# Patient Record
Sex: Female | Born: 1965 | Race: White | Hispanic: No | Smoking: Never smoker
Health system: Southern US, Community
[De-identification: ages and names within clinical notes are randomized; demographics above are authoritative.]

## PROBLEM LIST (undated history)

## (undated) DIAGNOSIS — D219 Benign neoplasm of connective and other soft tissue, unspecified: Secondary | ICD-10-CM

## (undated) DIAGNOSIS — E079 Disorder of thyroid, unspecified: Secondary | ICD-10-CM

## (undated) DIAGNOSIS — F329 Major depressive disorder, single episode, unspecified: Secondary | ICD-10-CM

## (undated) DIAGNOSIS — F32A Depression, unspecified: Secondary | ICD-10-CM

## (undated) DIAGNOSIS — F39 Unspecified mood [affective] disorder: Secondary | ICD-10-CM

## (undated) DIAGNOSIS — D649 Anemia, unspecified: Secondary | ICD-10-CM

## (undated) HISTORY — DX: Benign neoplasm of connective and other soft tissue, unspecified: D21.9

## (undated) HISTORY — PX: APPENDECTOMY: SHX54

## (undated) HISTORY — DX: Depression, unspecified: F32.A

## (undated) HISTORY — DX: Disorder of thyroid, unspecified: E07.9

## (undated) HISTORY — DX: Unspecified mood (affective) disorder: F39

## (undated) HISTORY — DX: Anemia, unspecified: D64.9

## (undated) HISTORY — DX: Major depressive disorder, single episode, unspecified: F32.9

---

## 1999-04-17 ENCOUNTER — Other Ambulatory Visit: Admission: RE | Admit: 1999-04-17 | Discharge: 1999-04-17 | Payer: Self-pay | Admitting: Internal Medicine

## 2000-04-24 ENCOUNTER — Other Ambulatory Visit: Admission: RE | Admit: 2000-04-24 | Discharge: 2000-04-24 | Payer: Self-pay | Admitting: Internal Medicine

## 2001-03-19 ENCOUNTER — Other Ambulatory Visit: Admission: RE | Admit: 2001-03-19 | Discharge: 2001-03-19 | Payer: Self-pay | Admitting: Internal Medicine

## 2002-02-22 ENCOUNTER — Other Ambulatory Visit: Admission: RE | Admit: 2002-02-22 | Discharge: 2002-02-22 | Payer: Self-pay | Admitting: Internal Medicine

## 2004-05-18 ENCOUNTER — Other Ambulatory Visit: Admission: RE | Admit: 2004-05-18 | Discharge: 2004-05-18 | Payer: Self-pay | Admitting: Family Medicine

## 2004-05-18 ENCOUNTER — Encounter: Payer: Self-pay | Admitting: Family Medicine

## 2004-05-18 LAB — CONVERTED CEMR LAB

## 2005-05-20 ENCOUNTER — Other Ambulatory Visit: Admission: RE | Admit: 2005-05-20 | Discharge: 2005-05-20 | Payer: Self-pay | Admitting: Family Medicine

## 2005-06-12 ENCOUNTER — Other Ambulatory Visit: Admission: RE | Admit: 2005-06-12 | Discharge: 2005-06-12 | Payer: Self-pay | Admitting: Obstetrics and Gynecology

## 2006-01-06 ENCOUNTER — Encounter: Payer: Self-pay | Admitting: Family Medicine

## 2006-01-06 LAB — CONVERTED CEMR LAB
ALT: 18 units/L
AST: 22 units/L
BUN: 13 mg/dL
CO2: 30 meq/L
Chloride: 104 meq/L
Potassium: 4.6 meq/L

## 2006-01-16 ENCOUNTER — Other Ambulatory Visit: Admission: RE | Admit: 2006-01-16 | Discharge: 2006-01-16 | Payer: Self-pay | Admitting: Obstetrics and Gynecology

## 2006-01-16 ENCOUNTER — Encounter: Payer: Self-pay | Admitting: Family Medicine

## 2006-04-07 ENCOUNTER — Encounter: Payer: Self-pay | Admitting: Family Medicine

## 2006-07-23 ENCOUNTER — Encounter: Payer: Self-pay | Admitting: Family Medicine

## 2006-07-23 LAB — CONVERTED CEMR LAB
HCT: 34.7 %
Platelets: 291 10*3/uL

## 2006-10-30 ENCOUNTER — Encounter: Payer: Self-pay | Admitting: Family Medicine

## 2006-11-07 ENCOUNTER — Encounter: Payer: Self-pay | Admitting: Family Medicine

## 2006-11-07 DIAGNOSIS — N870 Mild cervical dysplasia: Secondary | ICD-10-CM | POA: Insufficient documentation

## 2006-11-17 ENCOUNTER — Ambulatory Visit: Payer: Self-pay | Admitting: Family Medicine

## 2006-11-17 DIAGNOSIS — F329 Major depressive disorder, single episode, unspecified: Secondary | ICD-10-CM

## 2006-11-18 ENCOUNTER — Encounter: Payer: Self-pay | Admitting: Family Medicine

## 2006-11-18 ENCOUNTER — Telehealth (INDEPENDENT_AMBULATORY_CARE_PROVIDER_SITE_OTHER): Payer: Self-pay | Admitting: *Deleted

## 2006-11-26 ENCOUNTER — Encounter: Payer: Self-pay | Admitting: Family Medicine

## 2007-01-20 ENCOUNTER — Ambulatory Visit: Payer: Self-pay | Admitting: Family Medicine

## 2007-01-20 DIAGNOSIS — B373 Candidiasis of vulva and vagina: Secondary | ICD-10-CM

## 2007-01-21 ENCOUNTER — Encounter: Payer: Self-pay | Admitting: Family Medicine

## 2007-02-03 ENCOUNTER — Encounter: Payer: Self-pay | Admitting: Family Medicine

## 2007-02-17 ENCOUNTER — Telehealth: Payer: Self-pay | Admitting: Family Medicine

## 2007-03-20 ENCOUNTER — Encounter: Payer: Self-pay | Admitting: Family Medicine

## 2007-04-21 ENCOUNTER — Ambulatory Visit: Payer: Self-pay | Admitting: Family Medicine

## 2007-04-21 ENCOUNTER — Encounter: Payer: Self-pay | Admitting: Family Medicine

## 2007-04-21 ENCOUNTER — Other Ambulatory Visit: Admission: RE | Admit: 2007-04-21 | Discharge: 2007-04-21 | Payer: Self-pay | Admitting: Family Medicine

## 2007-04-22 ENCOUNTER — Encounter: Payer: Self-pay | Admitting: Family Medicine

## 2007-04-22 LAB — CONVERTED CEMR LAB
Clue Cells Wet Prep HPF POC: NONE SEEN
Trich, Wet Prep: NONE SEEN
Yeast Wet Prep HPF POC: NONE SEEN

## 2007-04-28 ENCOUNTER — Telehealth (INDEPENDENT_AMBULATORY_CARE_PROVIDER_SITE_OTHER): Payer: Self-pay | Admitting: *Deleted

## 2007-05-01 ENCOUNTER — Telehealth (INDEPENDENT_AMBULATORY_CARE_PROVIDER_SITE_OTHER): Payer: Self-pay | Admitting: *Deleted

## 2007-06-26 ENCOUNTER — Encounter: Payer: Self-pay | Admitting: Family Medicine

## 2007-06-29 ENCOUNTER — Encounter: Payer: Self-pay | Admitting: Family Medicine

## 2007-08-13 ENCOUNTER — Telehealth: Payer: Self-pay | Admitting: Family Medicine

## 2007-08-25 ENCOUNTER — Encounter: Payer: Self-pay | Admitting: Family Medicine

## 2007-08-27 ENCOUNTER — Telehealth: Payer: Self-pay | Admitting: Family Medicine

## 2007-11-03 ENCOUNTER — Telehealth (INDEPENDENT_AMBULATORY_CARE_PROVIDER_SITE_OTHER): Payer: Self-pay | Admitting: *Deleted

## 2007-11-11 ENCOUNTER — Encounter: Payer: Self-pay | Admitting: Family Medicine

## 2008-01-07 ENCOUNTER — Telehealth: Payer: Self-pay | Admitting: Family Medicine

## 2008-01-18 ENCOUNTER — Encounter: Payer: Self-pay | Admitting: Family Medicine

## 2008-03-25 ENCOUNTER — Encounter: Payer: Self-pay | Admitting: Family Medicine

## 2008-03-28 ENCOUNTER — Telehealth: Payer: Self-pay | Admitting: Family Medicine

## 2008-03-28 ENCOUNTER — Encounter: Payer: Self-pay | Admitting: Family Medicine

## 2008-04-13 ENCOUNTER — Ambulatory Visit: Payer: Self-pay | Admitting: Family Medicine

## 2008-05-17 ENCOUNTER — Ambulatory Visit: Payer: Self-pay | Admitting: Family Medicine

## 2008-11-15 ENCOUNTER — Ambulatory Visit: Payer: Self-pay | Admitting: Family Medicine

## 2008-12-15 ENCOUNTER — Ambulatory Visit: Payer: Self-pay | Admitting: Family Medicine

## 2009-05-22 ENCOUNTER — Other Ambulatory Visit: Admission: RE | Admit: 2009-05-22 | Discharge: 2009-05-22 | Payer: Self-pay | Admitting: Family Medicine

## 2009-05-22 ENCOUNTER — Ambulatory Visit: Payer: Self-pay | Admitting: Family Medicine

## 2009-10-26 ENCOUNTER — Ambulatory Visit (HOSPITAL_BASED_OUTPATIENT_CLINIC_OR_DEPARTMENT_OTHER): Admission: RE | Admit: 2009-10-26 | Discharge: 2009-10-26 | Payer: Self-pay | Admitting: Urology

## 2009-11-13 ENCOUNTER — Ambulatory Visit: Payer: Self-pay | Admitting: Family Medicine

## 2009-11-29 ENCOUNTER — Ambulatory Visit: Payer: Self-pay | Admitting: Family Medicine

## 2011-11-21 ENCOUNTER — Telehealth: Payer: Self-pay

## 2011-11-21 DIAGNOSIS — F988 Other specified behavioral and emotional disorders with onset usually occurring in childhood and adolescence: Secondary | ICD-10-CM

## 2011-11-21 NOTE — Telephone Encounter (Signed)
.  [facility] PT IN NEED OF HER ADDERALL. PLEASE CALL [phone] WHEN READY FOR P/U

## 2011-11-22 MED ORDER — AMPHETAMINE-DEXTROAMPHETAMINE 20 MG PO TABS: 40.0000 mg | ORAL_TABLET | Freq: Two times a day (BID) | ORAL | Status: DC

## 2011-11-22 NOTE — Telephone Encounter (Signed)
Please notify pt her Adderall RX is ready up front.

## 2011-11-22 NOTE — Telephone Encounter (Signed)
Rx in drawer. Pt notified. 

## 2011-12-30 ENCOUNTER — Telehealth: Payer: Self-pay

## 2011-12-30 NOTE — Telephone Encounter (Signed)
.  [facility] °PT IN NEED OF HER ADDERALL. PLEASE CALL [phone] WHEN READY FOR P/U ° °

## 2012-01-01 MED ORDER — AMPHETAMINE-DEXTROAMPHETAMINE 20 MG PO TABS: 40.0000 mg | ORAL_TABLET | Freq: Two times a day (BID) | ORAL | Status: DC

## 2012-01-01 NOTE — Telephone Encounter (Signed)
Chart pulled to PA 

## 2012-01-01 NOTE — Telephone Encounter (Signed)
Pt notified rx is ready for pick up

## 2012-01-01 NOTE — Telephone Encounter (Signed)
Signed at TL desk - should have an OV but I will be out of the office until July so we can refill until 7/13.

## 2012-01-03 ENCOUNTER — Other Ambulatory Visit: Payer: Self-pay | Admitting: Physician Assistant

## 2012-01-07 ENCOUNTER — Other Ambulatory Visit: Payer: Self-pay | Admitting: Physician Assistant

## 2012-01-26 ENCOUNTER — Other Ambulatory Visit: Payer: Self-pay | Admitting: Physician Assistant

## 2012-02-17 ENCOUNTER — Ambulatory Visit (INDEPENDENT_AMBULATORY_CARE_PROVIDER_SITE_OTHER): Payer: BC Managed Care – PPO | Admitting: Family Medicine

## 2012-02-17 VITALS — BP 111/69 | HR 84 | Temp 98.3°F | Resp 16 | Ht 67.5 in | Wt 133.0 lb

## 2012-02-17 DIAGNOSIS — R5381 Other malaise: Secondary | ICD-10-CM

## 2012-02-17 DIAGNOSIS — R5383 Other fatigue: Secondary | ICD-10-CM

## 2012-02-17 DIAGNOSIS — N938 Other specified abnormal uterine and vaginal bleeding: Secondary | ICD-10-CM

## 2012-02-17 DIAGNOSIS — F909 Attention-deficit hyperactivity disorder, unspecified type: Secondary | ICD-10-CM

## 2012-02-17 DIAGNOSIS — F988 Other specified behavioral and emotional disorders with onset usually occurring in childhood and adolescence: Secondary | ICD-10-CM

## 2012-02-17 DIAGNOSIS — M545 Low back pain, unspecified: Secondary | ICD-10-CM

## 2012-02-17 DIAGNOSIS — N978 Female infertility of other origin: Secondary | ICD-10-CM

## 2012-02-17 LAB — POCT URINALYSIS DIPSTICK
Glucose, UA: NEGATIVE
Leukocytes, UA: NEGATIVE
Nitrite, UA: NEGATIVE
Protein, UA: NEGATIVE
Spec Grav, UA: 1.02
Urobilinogen, UA: 0.2

## 2012-02-17 LAB — POCT CBC
Granulocyte percent: 55.9 %G (ref 37–80)
HCT, POC: 36.4 % — AB (ref 37.7–47.9)
Hemoglobin: 11.7 g/dL — AB (ref 12.2–16.2)
Lymph, poc: 2.8 (ref 0.6–3.4)
MCH, POC: 28.3 pg (ref 27–31.2)
MCHC: 32.1 g/dL (ref 31.8–35.4)
MCV: 88 fL (ref 80–97)
MID (cbc): 0.5 (ref 0–0.9)
MPV: 8.1 fL (ref 0–99.8)
POC Granulocyte: 4.2 (ref 2–6.9)
POC LYMPH PERCENT: 37.4 %L (ref 10–50)
POC MID %: 6.7 %M (ref 0–12)
Platelet Count, POC: 261 10*3/uL (ref 142–424)
RBC: 4.14 M/uL (ref 4.04–5.48)
RDW, POC: 13.1 %
WBC: 7.5 10*3/uL (ref 4.6–10.2)

## 2012-02-17 LAB — POCT UA - MICROSCOPIC ONLY
RBC, urine, microscopic: NEGATIVE
Yeast, UA: NEGATIVE

## 2012-02-17 LAB — TSH: TSH: 7.054 u[IU]/mL — ABNORMAL HIGH (ref 0.350–4.500)

## 2012-02-17 MED ORDER — NORETHINDRONE ACET-ETHINYL EST 1-20 MG-MCG PO TABS: 1.0000 | ORAL_TABLET | Freq: Every day | ORAL | Status: DC

## 2012-02-17 MED ORDER — AMPHETAMINE-DEXTROAMPHETAMINE 20 MG PO TABS: ORAL_TABLET | ORAL | Status: DC

## 2012-02-17 MED ORDER — DIVALPROEX SODIUM ER 500 MG PO TB24: ORAL_TABLET | ORAL | Status: DC

## 2012-02-17 NOTE — Progress Notes (Signed)
46 yo woman with overactive bladder who was diagnosed with UTI 6 weeks ago at urologist's office.  Never has the typical symptoms of UTI such as frequency, dysuria.  Instead, she is feeling low back pain and fatigue associated with odor of urine.. No h/o kidney stones. Associated with sense of low grade fever Works as Runner, broadcasting/film/video at [employer]  Also, her periods are beginning in the third week and persisting for 10 to 12 days which happened when she was taking the Yaz.  The Ocella was fine for 4 to 5 months, and now the periods are starting early again. H/o fibroids  O:  Alert, NAD No CVAT Abdomen:  nontender without mass or HSM  Results for orders placed in visit on 02/17/12  POCT URINALYSIS DIPSTICK      Component Value Range   Color, UA yelloe     Clarity, UA clear     Glucose, UA neg     Bilirubin, UA neg     Ketones, UA neg     Spec Grav, UA 1.020     Blood, UA neg     pH, UA 7.0     Protein, UA neg     Urobilinogen, UA 0.2     Nitrite, UA neg     Leukocytes, UA Negative    POCT UA - MICROSCOPIC ONLY      Component Value Range   WBC, Ur, HPF, POC 0-2     RBC, urine, microscopic neg     Bacteria, U Microscopic neg     Mucus, UA neg     Epithelial cells, urine per micros 0-2     Crystals, Ur, HPF, POC neg     Casts, Ur, LPF, POC neg     Yeast, UA neg     A: fatigue, low back pain with h/o UTI's No UTI detected now  P:  chk pelvic U/S, urine cx, TSH, CBC

## 2012-02-18 ENCOUNTER — Other Ambulatory Visit: Payer: Self-pay | Admitting: Family Medicine

## 2012-02-18 DIAGNOSIS — E039 Hypothyroidism, unspecified: Secondary | ICD-10-CM

## 2012-02-18 DIAGNOSIS — E031 Congenital hypothyroidism without goiter: Secondary | ICD-10-CM | POA: Insufficient documentation

## 2012-02-18 LAB — URINE CULTURE
Colony Count: NO GROWTH
Organism ID, Bacteria: NO GROWTH

## 2012-02-18 MED ORDER — LEVOTHYROXINE SODIUM 25 MCG PO TABS: 25.0000 ug | ORAL_TABLET | Freq: Every day | ORAL | Status: DC

## 2012-02-19 ENCOUNTER — Telehealth: Payer: Self-pay

## 2012-02-19 NOTE — Telephone Encounter (Signed)
Pt would like to know if it will be okay to take an iron supplement along with her thyroid medication. ( pt is unsure of the name of the medication)

## 2012-02-19 NOTE — Telephone Encounter (Signed)
Take the iron in the morning with food, take the thyroid medicine by itself at night just before bed

## 2012-02-19 NOTE — Telephone Encounter (Signed)
Pt has always been taking an iron supplement-was told to due this because she was borderline anemic. She just started taking levothyroid and it says not to take it with iron. What should she do? Please advise.

## 2012-02-20 ENCOUNTER — Telehealth: Payer: Self-pay

## 2012-02-20 ENCOUNTER — Other Ambulatory Visit: Payer: Self-pay | Admitting: Family Medicine

## 2012-02-20 DIAGNOSIS — R102 Pelvic and perineal pain: Secondary | ICD-10-CM

## 2012-02-20 NOTE — Telephone Encounter (Signed)
Gave pt instr's from provider and pt agreed.

## 2012-02-20 NOTE — Telephone Encounter (Signed)
.  [facility] The patient called to return call from [facility].  Please return call at [phone].

## 2012-02-25 ENCOUNTER — Ambulatory Visit
Admission: RE | Admit: 2012-02-25 | Discharge: 2012-02-25 | Disposition: A | Discharge status: Home / Self Care | Payer: BC Managed Care – PPO | Source: Ambulatory Visit | Attending: Family Medicine | Admitting: Family Medicine

## 2012-02-25 DIAGNOSIS — R102 Pelvic and perineal pain: Secondary | ICD-10-CM

## 2012-02-25 DIAGNOSIS — M545 Low back pain: Secondary | ICD-10-CM

## 2012-03-04 ENCOUNTER — Telehealth: Payer: Self-pay

## 2012-03-04 NOTE — Telephone Encounter (Signed)
Please call the [facility] on [address] And ask how they do the boric acid suppositories for this patient.  Please authorize a 1 month supply with 5 refills, or a 90-day supply with 1 RF.  I think this product is compounded, so I'm not sure how to enter it in.

## 2012-03-04 NOTE — Telephone Encounter (Signed)
Pt called on 02/20/12 and left message regarding medication. States during that time she spoke to someone about her boric acid and that was never taken care of. She states it needs to be sent to the pharmacy in capsule form instead of suppository b/c the suppository is hundreds of dollars. States [facility] [address] is the only one that carries it the way she needs it.  Best: [phone] Tagging this as high priority b/c it has been over a week since message given. bf

## 2012-03-05 ENCOUNTER — Other Ambulatory Visit: Payer: Self-pay

## 2012-03-05 DIAGNOSIS — D259 Leiomyoma of uterus, unspecified: Secondary | ICD-10-CM

## 2012-03-05 NOTE — Telephone Encounter (Signed)
Pt would like to know if the boric acid capsules could be called in asap, also pt  States that she was supposed to have an appt scheduled with an OBGYN for uterine fibroids but she still has not heard anything from [place].

## 2012-03-05 NOTE — Telephone Encounter (Signed)
Called [facility] [address] and spoke w/pharmacist to make sure right Rx called in for pt's Boric Acid capsules. They verified that this is a compounded capsule Boric Acid 600 mg capsule. OK'd 30 day supply plus 5 RFs. Checked on pt's referral to GYN for uterine fibroids which never got put in the system. Put in order for GYN referral w/reback to Dr Milus Glazier. Called pt and apologized that original message had not been routed correctly and that I have called in RFs and started her referral. Pt thanked [place]. Dr Milus Glazier, I'm forwarding this to you FYI about the referral order for you to co-sign.

## 2012-03-06 NOTE — Telephone Encounter (Signed)
thanks

## 2012-03-11 ENCOUNTER — Other Ambulatory Visit: Payer: Self-pay | Admitting: Family Medicine

## 2012-03-11 ENCOUNTER — Telehealth: Payer: Self-pay

## 2012-03-11 DIAGNOSIS — E039 Hypothyroidism, unspecified: Secondary | ICD-10-CM

## 2012-03-11 NOTE — Telephone Encounter (Signed)
I have referred patient to Dr. Leslie Dales

## 2012-03-11 NOTE — Telephone Encounter (Signed)
Pt.notified

## 2012-03-11 NOTE — Telephone Encounter (Signed)
Pt diagnosed by Dr. Elbert Ewings with underactive thyroid. Does not feel that her meds are working and would like to be referred to Dr. Sharl Ma or Dr. Leslie Dales at [facility].

## 2012-03-12 ENCOUNTER — Ambulatory Visit: Payer: BC Managed Care – PPO | Admitting: Gynecology

## 2012-03-26 ENCOUNTER — Ambulatory Visit: Payer: BC Managed Care – PPO | Admitting: Family Medicine

## 2012-05-07 ENCOUNTER — Telehealth: Payer: Self-pay

## 2012-05-07 DIAGNOSIS — F988 Other specified behavioral and emotional disorders with onset usually occurring in childhood and adolescence: Secondary | ICD-10-CM

## 2012-05-07 NOTE — Telephone Encounter (Signed)
Pt is requesting adderall refill 40 mg 2 x daily   States we have to call insurance company for annual approval  [phone]  Call back when ready for pick up  [phone]

## 2012-05-08 MED ORDER — AMPHETAMINE-DEXTROAMPHETAMINE 20 MG PO TABS: 40.0000 mg | ORAL_TABLET | Freq: Two times a day (BID) | ORAL | Status: DC

## 2012-05-08 NOTE — Telephone Encounter (Signed)
Rx printed and called [employer] to get PA done for medication and received approval through 05/08/13, case #[id number]. Notified pt of both.

## 2012-05-08 NOTE — Telephone Encounter (Signed)
Done and printed

## 2012-07-23 ENCOUNTER — Telehealth: Payer: Self-pay

## 2012-07-23 DIAGNOSIS — F988 Other specified behavioral and emotional disorders with onset usually occurring in childhood and adolescence: Secondary | ICD-10-CM

## 2012-07-23 MED ORDER — AMPHETAMINE-DEXTROAMPHETAMINE 20 MG PO TABS: ORAL_TABLET | ORAL | Status: DC

## 2012-07-23 NOTE — Telephone Encounter (Signed)
Rx done and printed. Will need an office visit for further refill.

## 2012-07-23 NOTE — Telephone Encounter (Signed)
Was here in April for ADD, due this month for follow up, I will advise her ,please advise on renewal Adderall 20 mg 2 bid

## 2012-07-23 NOTE — Telephone Encounter (Signed)
Patient needs new RX for adderall. Please call when ready. Best number is [phone].

## 2012-07-23 NOTE — Telephone Encounter (Signed)
Notified pt Rx is ready for p/up. Pt agreed to f/up bf next RF due.

## 2012-09-29 ENCOUNTER — Telehealth: Payer: Self-pay

## 2012-09-29 DIAGNOSIS — F988 Other specified behavioral and emotional disorders with onset usually occurring in childhood and adolescence: Secondary | ICD-10-CM

## 2012-09-29 NOTE — Telephone Encounter (Signed)
Kerri Murphy  PT NEEDS ADDERALL REFILLED UNTIL HER APPT JAN 8TH SHE HAS ENOUGH OF OTHER MEDS UNTIL THEN    PT PHONE [phone]

## 2012-09-29 NOTE — Telephone Encounter (Signed)
Was due for follow up in October, patient advised x2 looks like she was scheduled with you this week, but we had to RS this appt to next month please advise.I pended Rx

## 2012-09-30 ENCOUNTER — Ambulatory Visit: Payer: BC Managed Care – PPO | Admitting: Physician Assistant

## 2012-09-30 MED ORDER — AMPHETAMINE-DEXTROAMPHETAMINE 20 MG PO TABS: ORAL_TABLET | ORAL | Status: DC

## 2012-09-30 NOTE — Telephone Encounter (Signed)
Pt notified that rx is ready for pick up

## 2012-09-30 NOTE — Telephone Encounter (Signed)
At TL desk 

## 2012-10-05 ENCOUNTER — Telehealth: Payer: Self-pay

## 2012-10-05 ENCOUNTER — Ambulatory Visit (INDEPENDENT_AMBULATORY_CARE_PROVIDER_SITE_OTHER): Payer: BC Managed Care – PPO

## 2012-10-05 NOTE — Telephone Encounter (Signed)
Spoke with patient, she wanted to go ahead with medication.  Advised patient she would need to be evaluated even though she was on Zoloft before.  Advised her that any of our providers would be happy to see her.  States she will return to clinic tomorrow to see Maralyn Sago.

## 2012-10-05 NOTE — Telephone Encounter (Signed)
Pt states she wanted to see Benny Lennert today to get back on her zoloft. Maralyn Sago was not here so pt will rtc tomorrow to see her and wanted to leave message in the mean time to see if that's something she can get started on without seeing sarah or if she has to see her again.  bf

## 2012-10-06 ENCOUNTER — Ambulatory Visit (INDEPENDENT_AMBULATORY_CARE_PROVIDER_SITE_OTHER): Payer: BC Managed Care – PPO | Admitting: Physician Assistant

## 2012-10-06 VITALS — BP 125/76 | HR 64 | Temp 98.1°F | Resp 16 | Ht 69.5 in | Wt 137.0 lb

## 2012-10-06 DIAGNOSIS — N3281 Overactive bladder: Secondary | ICD-10-CM | POA: Insufficient documentation

## 2012-10-06 DIAGNOSIS — F329 Major depressive disorder, single episode, unspecified: Secondary | ICD-10-CM

## 2012-10-06 DIAGNOSIS — N926 Irregular menstruation, unspecified: Secondary | ICD-10-CM

## 2012-10-06 MED ORDER — SERTRALINE HCL 100 MG PO TABS: 100.0000 mg | ORAL_TABLET | Freq: Every day | ORAL | Status: DC

## 2012-10-06 MED ORDER — DIVALPROEX SODIUM ER 500 MG PO TB24: 1000.0000 mg | ORAL_TABLET | Freq: Every day | ORAL | Status: DC

## 2012-10-06 MED ORDER — NORGESTIM-ETH ESTRAD TRIPHASIC 0.18/0.215/0.25 MG-35 MCG PO TABS: 1.0000 | ORAL_TABLET | Freq: Every day | ORAL | Status: DC

## 2012-10-06 NOTE — Progress Notes (Signed)
   [address] [place] [address]   Phone [phone]  Subjective:    Patient ID: Kerri Murphy, female    DOB: 23-Feb-1966, 46 y.o.   MRN: [id number]  HPI Pt presents to clinic with her depression worsening and interested in restarting medications.  She went off of her Zoloft because she thought she was better but now knows she is not.  She had been experiencing some fatigue and was diagnosed with hypothyroidism and thought with that treatment her fatigue would improve but it has not and now she knows that it is her depression getting worse.  She now does not want to leave the house - in fact she wants her meds sent to a pharmacy with a drive through so she does not have to get out of her car.  She is on winter break from school.  She has not been off her depakote for about 2 wks because she has not left her house to get to the pharmacy.  She does not feel that her depression is related to the holidays.  She really had hoped that her depression was improved and she did not need medications but now she realizes that the meds are helping.  She is no longer seeing Dr Ledon Snare but has called her current counselor for an appt.  She also has continued to experience break through bleeding with the new OCPs in the 3rd week.  She has been told by the GYN that her fibroids are not the cause of her irregular bleeding.  Review of Systems  Constitutional: Negative for fever and chills.  Psychiatric/Behavioral: Positive for dysphoric mood. Negative for suicidal ideas, sleep disturbance and self-injury.       Objective:   Physical Exam  Vitals reviewed. Constitutional: She is oriented to person, place, and time. She appears well-developed and well-nourished.  HENT:  Head: Normocephalic and atraumatic.  Right Ear: External ear normal.  Left Ear: External ear normal.  Eyes: Conjunctivae normal are normal.  Cardiovascular: Normal rate, regular rhythm and normal heart sounds.   Pulmonary/Chest: Effort  normal.  Neurological: She is alert and oriented to person, place, and time.  Skin: Skin is warm and dry.  Psychiatric: Her speech is normal and behavior is normal. Judgment and thought content normal. Her affect is blunt.          Assessment & Plan:   1. Depression  divalproex (DEPAKOTE ER) 500 MG 24 hr tablet, sertraline (ZOLOFT) 100 MG tablet  2. Irregular menses  Norgestimate-Ethinyl Estradiol Triphasic 0.18/0.215/0.25 MG-35 MCG tablet   Will restart pt on her Zoloft - expect pt to need an increased dose.  Will restart pt's depakote and will get a level in about 3-4 wks at her recheck appt.  She is most controlled at 1500mg  daily but she gets diarrhea at that dose.  Hopefully when we get her back on her Zoloft (which causes her to be constipated) we will be able to increase her dose - depending on her response. Will change her OCP to a triphasic pill to see if the change in progesterone will help with her break through bleeding.  We will monitor this over the next 3 months.  Pt agrees with this plan.

## 2012-10-28 ENCOUNTER — Ambulatory Visit: Payer: BC Managed Care – PPO | Admitting: Physician Assistant

## 2012-11-07 ENCOUNTER — Other Ambulatory Visit: Payer: Self-pay | Admitting: Physician Assistant

## 2012-11-07 NOTE — Telephone Encounter (Signed)
Needs office visit and labs.

## 2012-11-09 NOTE — Progress Notes (Signed)
Received a prior auth request for pt's boric acid vag capsules. Called pt to check to see if ins has been covering this Rx since it is compounded. Pt stated that she pays OOP for this Rx and I faxed back this info to [facility]

## 2012-12-10 ENCOUNTER — Telehealth: Payer: Self-pay

## 2012-12-10 NOTE — Telephone Encounter (Signed)
Pt needs an ov for a depakote level and I can fill at that time.

## 2012-12-10 NOTE — Telephone Encounter (Signed)
PT IN NEED OF HER ADDERALL. PLEASE CALL [phone] WHEN READY FOR P/U

## 2012-12-11 ENCOUNTER — Encounter: Payer: Self-pay | Admitting: Radiology

## 2012-12-11 NOTE — Telephone Encounter (Signed)
Left message for her to call me back. 

## 2012-12-11 NOTE — Telephone Encounter (Signed)
I advised her of Sarah hours.

## 2013-01-05 ENCOUNTER — Ambulatory Visit (INDEPENDENT_AMBULATORY_CARE_PROVIDER_SITE_OTHER): Payer: BC Managed Care – PPO | Admitting: Physician Assistant

## 2013-01-05 VITALS — BP 125/88 | HR 75 | Temp 98.0°F | Resp 18 | Ht 68.5 in | Wt 136.6 lb

## 2013-01-05 DIAGNOSIS — F339 Major depressive disorder, recurrent, unspecified: Secondary | ICD-10-CM

## 2013-01-05 DIAGNOSIS — F988 Other specified behavioral and emotional disorders with onset usually occurring in childhood and adolescence: Secondary | ICD-10-CM

## 2013-01-05 MED ORDER — DIVALPROEX SODIUM ER 500 MG PO TB24: 1000.0000 mg | ORAL_TABLET | Freq: Every day | ORAL | Status: DC

## 2013-01-05 MED ORDER — SERTRALINE HCL 100 MG PO TABS: 100.0000 mg | ORAL_TABLET | Freq: Every day | ORAL | Status: DC

## 2013-01-05 MED ORDER — AMPHETAMINE-DEXTROAMPHETAMINE 20 MG PO TABS: ORAL_TABLET | ORAL | Status: DC

## 2013-01-05 NOTE — Progress Notes (Signed)
   [address] [place] [address]   Phone [phone]  Subjective:    Patient ID: Kerri Murphy, female    DOB: 01/17/1966, 47 y.o.   MRN: [id number]  HPI  Pt presents to clinic for med refill and depakote level.  She has been without medications for 3 days.  She is much better compared with the last time I saw her.  Her depression is improved.  Pt is stable on her Adderall.  Review of Systems  Psychiatric/Behavioral: Positive for dysphoric mood.       Objective:   Physical Exam  Vitals reviewed. Constitutional: She appears well-developed and well-nourished.  HENT:  Head: Normocephalic and atraumatic.  Right Ear: External ear normal.  Left Ear: External ear normal.  Pulmonary/Chest: Effort normal.  Skin: Skin is warm and dry.  Psychiatric: She has a normal mood and affect. Her behavior is normal. Judgment and thought content normal.       Assessment & Plan:  ADD (attention deficit disorder) - Plan: amphetamine-dextroamphetamine (ADDERALL) 20 MG tablet. Can fill for the next 6 months.  Depression, major, recurrent - Plan: sertraline (ZOLOFT) 100 MG tablet, divalproex (DEPAKOTE ER) 500 MG 24 hr tablet, Valproic acid level to be drawn in the next 2 weeks to see what her levels are on her current dose.  Once the levels comes back will refill her meds for 6 months.

## 2013-01-16 ENCOUNTER — Other Ambulatory Visit (INDEPENDENT_AMBULATORY_CARE_PROVIDER_SITE_OTHER): Payer: BC Managed Care – PPO | Admitting: Family Medicine

## 2013-01-16 VITALS — BP 133/77 | HR 83

## 2013-01-16 DIAGNOSIS — F339 Major depressive disorder, recurrent, unspecified: Secondary | ICD-10-CM

## 2013-01-16 DIAGNOSIS — Z79899 Other long term (current) drug therapy: Secondary | ICD-10-CM

## 2013-02-15 ENCOUNTER — Telehealth: Payer: Self-pay | Admitting: Family Medicine

## 2013-02-15 DIAGNOSIS — F339 Major depressive disorder, recurrent, unspecified: Secondary | ICD-10-CM

## 2013-02-15 MED ORDER — DIVALPROEX SODIUM ER 500 MG PO TB24: 1000.0000 mg | ORAL_TABLET | Freq: Every day | ORAL | Status: DC

## 2013-02-15 NOTE — Telephone Encounter (Signed)
Patient at pharmacy trying to get refill of Depakote. pharmacy states they have faxed twice. I don't see it anywhere please advise.

## 2013-02-15 NOTE — Telephone Encounter (Signed)
Done for the next 6 months.

## 2013-04-07 ENCOUNTER — Telehealth: Payer: Self-pay

## 2013-04-07 DIAGNOSIS — F988 Other specified behavioral and emotional disorders with onset usually occurring in childhood and adolescence: Secondary | ICD-10-CM

## 2013-04-07 NOTE — Telephone Encounter (Signed)
Patient is needing a refill on her adderall 20mg .   Best#:[phone]

## 2013-04-08 MED ORDER — AMPHETAMINE-DEXTROAMPHETAMINE 20 MG PO TABS: ORAL_TABLET | ORAL | Status: DC

## 2013-04-08 NOTE — Telephone Encounter (Signed)
At [facility] -ready for pick-up

## 2013-04-09 NOTE — Telephone Encounter (Signed)
Spoke with pt advised Rx ready to be picked up.

## 2013-04-12 ENCOUNTER — Other Ambulatory Visit: Payer: Self-pay | Admitting: Physician Assistant

## 2013-06-11 ENCOUNTER — Telehealth: Payer: Self-pay

## 2013-06-11 DIAGNOSIS — F988 Other specified behavioral and emotional disorders with onset usually occurring in childhood and adolescence: Secondary | ICD-10-CM

## 2013-06-11 NOTE — Telephone Encounter (Signed)
Pt would like a refill on adderall. Best# [phone]

## 2013-06-13 MED ORDER — AMPHETAMINE-DEXTROAMPHETAMINE 20 MG PO TABS: ORAL_TABLET | ORAL | Status: DC

## 2013-06-13 NOTE — Telephone Encounter (Signed)
Ready to be picked up - please remind pt of need for recheck next month and we can offer her an appt.

## 2013-06-14 NOTE — Telephone Encounter (Signed)
LMOM Rx ready to pick up. Rx in pick up draw.

## 2013-06-15 NOTE — Progress Notes (Signed)
PA approved for Adderall 20 mg through 06/15/14, case # [id number]. Notified pharmacy

## 2013-08-25 ENCOUNTER — Telehealth: Payer: Self-pay

## 2013-08-25 DIAGNOSIS — F988 Other specified behavioral and emotional disorders with onset usually occurring in childhood and adolescence: Secondary | ICD-10-CM

## 2013-08-25 NOTE — Telephone Encounter (Signed)
Pt is calling to request a refill on her addreall Call back number is [phone]

## 2013-08-26 MED ORDER — AMPHETAMINE-DEXTROAMPHETAMINE 20 MG PO TABS: ORAL_TABLET | ORAL | Status: DC

## 2013-08-26 NOTE — Telephone Encounter (Signed)
Needs to have an OV before she gets another Rx.

## 2013-08-27 NOTE — Telephone Encounter (Signed)
Called to advise. No answer.

## 2013-08-27 NOTE — Telephone Encounter (Signed)
Patient advised.

## 2013-09-01 ENCOUNTER — Ambulatory Visit (INDEPENDENT_AMBULATORY_CARE_PROVIDER_SITE_OTHER): Payer: BC Managed Care – PPO | Admitting: Physician Assistant

## 2013-09-01 VITALS — BP 108/64 | HR 66 | Temp 97.6°F | Resp 18 | Ht 68.25 in | Wt 134.2 lb

## 2013-09-01 DIAGNOSIS — F339 Major depressive disorder, recurrent, unspecified: Secondary | ICD-10-CM

## 2013-09-01 DIAGNOSIS — N926 Irregular menstruation, unspecified: Secondary | ICD-10-CM

## 2013-09-01 MED ORDER — NORGESTIM-ETH ESTRAD TRIPHASIC 0.18/0.215/0.25 MG-35 MCG PO TABS: 1.0000 | ORAL_TABLET | Freq: Every day | ORAL | Status: DC

## 2013-09-01 MED ORDER — SERTRALINE HCL 100 MG PO TABS: ORAL_TABLET | ORAL | Status: DC

## 2013-09-01 MED ORDER — DIVALPROEX SODIUM ER 500 MG PO TB24: 1000.0000 mg | ORAL_TABLET | Freq: Every day | ORAL | Status: DC

## 2013-09-01 NOTE — Progress Notes (Signed)
   [address] [place] [address]   Phone [phone]  Subjective:    Patient ID: Kerri Murphy, female    DOB: 1966-04-19, 47 y.o.   MRN: [id number]  HPI Pt presents to clinic for med refill.  Her depression is well controlled on her Depakote and Zoloft.  She needs a refill on her OCP.  Review of Systems     Objective:   Physical Exam  Vitals reviewed. Constitutional: She is oriented to person, place, and time. She appears well-developed and well-nourished.  HENT:  Head: Normocephalic and atraumatic.  Right Ear: External ear normal.  Left Ear: External ear normal.  Pulmonary/Chest: Effort normal.  Neurological: She is alert and oriented to person, place, and time.  Skin: Skin is warm and dry.  Psychiatric: She has a normal mood and affect. Her behavior is normal. Judgment and thought content normal.          Assessment & Plan:  Depression, major, recurrent - Plan: divalproex (DEPAKOTE ER) 500 MG 24 hr tablet, sertraline (ZOLOFT) 100 MG tablet  Irregular menses - Plan: Norgestimate-Ethinyl Estradiol Triphasic 0.18/0.215/0.25 MG-35 MCG tablet  She will RTC in 6 months for her CPE with a pap smear.  Can refill her ADD medications for 6 months.  Benny Lennert PA-C 09/01/2013 8:35 PM

## 2013-09-02 NOTE — Progress Notes (Signed)
Left message for patient regarding scheduling appt for a CPE.

## 2013-09-04 ENCOUNTER — Other Ambulatory Visit: Payer: Self-pay | Admitting: Physician Assistant

## 2013-09-10 NOTE — Progress Notes (Signed)
Made appointment with Maralyn Sago on 03/02/14

## 2013-11-25 ENCOUNTER — Telehealth: Payer: Self-pay

## 2013-11-25 DIAGNOSIS — F988 Other specified behavioral and emotional disorders with onset usually occurring in childhood and adolescence: Secondary | ICD-10-CM

## 2013-11-25 MED ORDER — AMPHETAMINE-DEXTROAMPHETAMINE 20 MG PO TABS: ORAL_TABLET | ORAL | Status: DC

## 2013-11-25 NOTE — Telephone Encounter (Signed)
Patient needs refill of Adderall. Patient only has 2 days of medication remaining.     Thank You!!!

## 2013-11-25 NOTE — Telephone Encounter (Signed)
Ready to be picked up

## 2013-11-26 NOTE — Telephone Encounter (Signed)
LMOM that Rx is ready.

## 2013-12-24 ENCOUNTER — Ambulatory Visit (INDEPENDENT_AMBULATORY_CARE_PROVIDER_SITE_OTHER): Payer: BC Managed Care – PPO | Admitting: Family Medicine

## 2013-12-24 VITALS — BP 122/70 | HR 81 | Temp 98.2°F | Resp 18 | Ht 69.0 in | Wt 135.0 lb

## 2013-12-24 DIAGNOSIS — IMO0001 Reserved for inherently not codable concepts without codable children: Secondary | ICD-10-CM

## 2013-12-24 DIAGNOSIS — R350 Frequency of micturition: Secondary | ICD-10-CM

## 2013-12-24 LAB — POCT URINALYSIS DIPSTICK
Bilirubin, UA: NEGATIVE
Blood, UA: NEGATIVE
Glucose, UA: NEGATIVE
Ketones, UA: NEGATIVE
Nitrite, UA: NEGATIVE
PROTEIN UA: NEGATIVE
Spec Grav, UA: 1.015
Urobilinogen, UA: 0.2
pH, UA: 7

## 2013-12-24 LAB — POCT UA - MICROSCOPIC ONLY
CASTS, UR, LPF, POC: NEGATIVE
CRYSTALS, UR, HPF, POC: NEGATIVE
MUCUS UA: NEGATIVE
Yeast, UA: NEGATIVE

## 2013-12-24 MED ORDER — CEPHALEXIN 500 MG PO CAPS: 500.0000 mg | ORAL_CAPSULE | Freq: Two times a day (BID) | ORAL | Status: DC

## 2013-12-24 NOTE — Patient Instructions (Signed)
Use the keflex twice a day while we await your urine culture results.   In the meantime you can use pyridium otc (azo is one brand name) for the discomfort  Let me know if you are getting worse, running a fever or have any other concerns.

## 2013-12-24 NOTE — Progress Notes (Addendum)
[facility] [address], [place] [address] [phone]  Date:  12/24/2013   Name:  Kerri Murphy   DOB:  1966/05/08   MRN:  [id number]  PCP:  Loyal Gambler, DO    Chief Complaint: Urinary Urgency   History of Present Illness:  Kerri Murphy is a 48 y.o. very pleasant female patient who presents with the following:  She feels like she has had a UTI with sx coming nad going for a couple of months. She will noted urinary urgency, back ache and some pain when she urinates. She will also notice a bad smell in her urine.    No abdominal pain No hematuria.   No fever or vomiting.  She does feel "run down." No vaginal sx  She has had a few UTIs over the years.  [facility]  She does not have any renal isues otherwise.   Patient Active Problem List   Diagnosis Date Noted  . Overactive bladder 10/06/2012  . Hypothyroid 02/18/2012  . ADD (attention deficit disorder with hyperactivity) 02/17/2012  . CANDIDIASIS, VULVOVAGINAL 01/20/2007  . Major depressive disorder 11/17/2006  . DYSPLASIA, CERVIX, MILD 11/07/2006    Past Medical History  Diagnosis Date  . Mood disorder   . Thyroid disease   . Depression   . Anemia     Past Surgical History  Procedure Laterality Date  . Appendectomy      History  Substance Use Topics  . Smoking status: Never Smoker   . Smokeless tobacco: Never Used  . Alcohol Use: Yes     Comment: social    Family History  Problem Relation Age of Onset  . Heart disease Father     No Known Allergies  Medication list has been reviewed and updated.  Current Outpatient Prescriptions on File Prior to Visit  Medication Sig Dispense Refill  . amphetamine-dextroamphetamine (ADDERALL) 20 MG tablet Two tablets twice a day  120 tablet  0  . divalproex (DEPAKOTE ER) 500 MG 24 hr tablet Take 2 tablets (1,000 mg total) by mouth daily.  60 tablet  5  . fesoterodine (TOVIAZ) 8 MG TB24 Take 8 mg by mouth daily.       Marland Kitchen levothyroxine (SYNTHROID, LEVOTHROID) 50 MCG tablet Take 50 mcg by mouth daily.      . Norgestimate-Ethinyl Estradiol Triphasic 0.18/0.215/0.25 MG-35 MCG tablet Take 1 tablet by mouth daily.  1 Package  5  . sertraline (ZOLOFT) 100 MG tablet TAKE 1 TABLET BY MOUTH EVERY DAY  30 tablet  5   No current facility-administered medications on file prior to visit.    Review of Systems:  As per HPI- otherwise negative.   Physical Examination: Filed Vitals:   12/24/13 1833  BP: 122/70  Pulse: 81  Temp: 98.2 F (36.8 C)  Resp: 18   Filed Vitals:   12/24/13 1833  Height: 5\' 9"  (1.753 m)  Weight: 135 lb (61.236 kg)   Body mass index is 19.93 kg/(m^2). Ideal Body Weight: Weight in (lb) to have BMI = 25: 168.9  GEN: WDWN, NAD, Non-toxic, A & O x 3, thin build, looks well HEENT: Atraumatic, Normocephalic. Neck supple. No masses, No LAD. Ears and Nose: No external deformity. CV: RRR, No M/G/R. No JVD. No thrill. No extra heart sounds. PULM: CTA B, no wheezes, crackles, rhonchi. No retractions. No resp. distress. No accessory muscle use. ABD: S, NT, ND, +BS. No rebound. No HSM.  No CVA tenderness  EXTR:  No c/c/e NEURO Normal gait.  PSYCH: Normally interactive. Conversant. Not depressed or anxious appearing.  Calm demeanor.   Results for orders placed in visit on 12/24/13  POCT URINALYSIS DIPSTICK      Result Value Ref Range   Color, UA yellow     Clarity, UA clear     Glucose, UA neg     Bilirubin, UA neg     Ketones, UA neg     Spec Grav, UA 1.015     Blood, UA neg     pH, UA 7.0     Protein, UA neg     Urobilinogen, UA 0.2     Nitrite, UA neg     Leukocytes, UA moderate (2+)    POCT UA - MICROSCOPIC ONLY      Result Value Ref Range   WBC, Ur, HPF, POC 2-5     RBC, urine, microscopic tntc     Bacteria, U Microscopic trace     Mucus, UA neg     Epithelial cells, urine per micros 4-5     Crystals, Ur, HPF, POC neg     Casts, Ur, LPF, POC neg     Yeast, UA neg      Assessment and Plan: Frequency - Plan: POCT urinalysis dipstick, POCT UA - Microscopic Only, Urine culture, cephALEXin (KEFLEX) 500 MG capsule  Probably UTI.  Treat with keflex, await urine culture.  Will plan further follow- up pending labs. If she is getting worse she is to let me know. Reminded that abx can interfere with OCP  Signed Lamar Blinks, MD  Called her back to go over labs on 3/10.  Her urine culture did grow out GBS; this is susceptible to keflex.  She overall feels better but notes that she still has a urinary odor.  We may need to consider another cause such as BV.  She will plan to see me on Monday for a recheck.  Her urine culture is borderline positive given bacterial growth and group b strep as the bacteria.  May need to be seen by her urologist as well.  Will follow-up by appt on Monday and make a plan

## 2013-12-26 LAB — URINE CULTURE

## 2013-12-28 ENCOUNTER — Other Ambulatory Visit: Payer: Self-pay | Admitting: Physician Assistant

## 2013-12-28 ENCOUNTER — Encounter: Payer: Self-pay | Admitting: Family Medicine

## 2014-01-03 ENCOUNTER — Ambulatory Visit (INDEPENDENT_AMBULATORY_CARE_PROVIDER_SITE_OTHER): Payer: BC Managed Care – PPO | Admitting: Family Medicine

## 2014-01-03 VITALS — BP 122/88 | HR 81 | Temp 98.1°F | Resp 16 | Ht 67.0 in | Wt 133.0 lb

## 2014-01-03 DIAGNOSIS — N390 Urinary tract infection, site not specified: Secondary | ICD-10-CM

## 2014-01-03 LAB — POCT URINALYSIS DIPSTICK
BILIRUBIN UA: NEGATIVE
Glucose, UA: NEGATIVE
Ketones, UA: NEGATIVE
Leukocytes, UA: NEGATIVE
NITRITE UA: NEGATIVE
PH UA: 7
PROTEIN UA: NEGATIVE
RBC UA: NEGATIVE
Spec Grav, UA: 1.02
Urobilinogen, UA: 0.2

## 2014-01-03 LAB — POCT UA - MICROSCOPIC ONLY
AMORPHOUS: POSITIVE
CASTS, UR, LPF, POC: NEGATIVE
CRYSTALS, UR, HPF, POC: NEGATIVE
Mucus, UA: NEGATIVE
YEAST UA: NEGATIVE

## 2014-01-03 LAB — POCT WET PREP WITH KOH
Clue Cells Wet Prep HPF POC: NEGATIVE
KOH PREP POC: NEGATIVE
TRICHOMONAS UA: NEGATIVE
YEAST WET PREP PER HPF POC: NEGATIVE

## 2014-01-03 NOTE — Progress Notes (Signed)
[facility] [address] [place] [address] [phone]  Date:  01/03/2014   Name:  Kerri Murphy   DOB:  Jul 21, 1966   MRN:  [id number]  PCP:  Loyal Gambler, DO    Chief Complaint: Follow-up   History of Present Illness:  Kerri Murphy is a 48 y.o. very pleasant female patient who presents with the following:  Here to follow-up UTI.  She was noted to have low growth of GBS on her urine culture from 12/24/13.  Her urine is less smelly, but she still does not feel 100% well.  However she is better.    No abdomianl pain.  She has some mild LBP.   No vaginal sx- no itching or burning.   Never a smoker She does not feel there is any STI risk  Patient Active Problem List   Diagnosis Date Noted  . Overactive bladder 10/06/2012  . Hypothyroid 02/18/2012  . ADD (attention deficit disorder with hyperactivity) 02/17/2012  . CANDIDIASIS, VULVOVAGINAL 01/20/2007  . Major depressive disorder 11/17/2006  . DYSPLASIA, CERVIX, MILD 11/07/2006    Past Medical History  Diagnosis Date  . Mood disorder   . Thyroid disease   . Depression   . Anemia     Past Surgical History  Procedure Laterality Date  . Appendectomy      History  Substance Use Topics  . Smoking status: Never Smoker   . Smokeless tobacco: Never Used  . Alcohol Use: Yes     Comment: social    Family History  Problem Relation Age of Onset  . Heart disease Father     No Known Allergies  Medication list has been reviewed and updated.  Current Outpatient Prescriptions on File Prior to Visit  Medication Sig Dispense Refill  . amphetamine-dextroamphetamine (ADDERALL) 20 MG tablet Two tablets twice a day  120 tablet  0  . divalproex (DEPAKOTE ER) 500 MG 24 hr tablet Take 2 tablets (1,000 mg total) by mouth daily.  60 tablet  5  . fesoterodine (TOVIAZ) 8 MG TB24 Take 8 mg by mouth daily.      Marland Kitchen levothyroxine (SYNTHROID, LEVOTHROID) 50 MCG tablet Take 50 mcg by mouth daily.      .  Norgestimate-Ethinyl Estradiol Triphasic 0.18/0.215/0.25 MG-35 MCG tablet Take 1 tablet by mouth daily.  1 Package  5  . cephALEXin (KEFLEX) 500 MG capsule Take 1 capsule (500 mg total) by mouth 2 (two) times daily.  14 capsule  0  . sertraline (ZOLOFT) 100 MG tablet TAKE 1 TABLET BY MOUTH EVERY DAY  30 tablet  5   No current facility-administered medications on file prior to visit.    Review of Systems:  As per HPI- otherwise negative.   Physical Examination: Filed Vitals:   01/03/14 1412  BP: 122/88  Pulse: 81  Temp: 98.1 F (36.7 C)  Resp: 16   Filed Vitals:   01/03/14 1412  Height: 5\' 7"  (1.702 m)  Weight: 133 lb (60.328 kg)   Body mass index is 20.83 kg/(m^2). Ideal Body Weight: Weight in (lb) to have BMI = 25: 159.3  GEN: WDWN, NAD, Non-toxic, A & O x 3, slim build, looks well HEENT: Atraumatic, Normocephalic. Neck supple. No masses, No LAD. Ears and Nose: No external deformity. CV: RRR, No M/G/R. No JVD. No thrill. No extra heart sounds. PULM: CTA B, no wheezes, crackles, rhonchi. No retractions. No resp. distress. No accessory muscle use. ABD: S, NT, ND, +BS. No rebound. No  HSM. EXTR: No c/c/e NEURO Normal gait.  PSYCH: Normally interactive. Conversant. Not depressed or anxious appearing.  Calm demeanor.   Results for orders placed in visit on 01/03/14  POCT UA - MICROSCOPIC ONLY      Result Value Ref Range   WBC, Ur, HPF, POC 0-1     RBC, urine, microscopic 0-1     Bacteria, U Microscopic 1+     Mucus, UA neg     Epithelial cells, urine per micros 1-2     Crystals, Ur, HPF, POC neg     Casts, Ur, LPF, POC neg     Yeast, UA neg     Amorphous positive    POCT URINALYSIS DIPSTICK      Result Value Ref Range   Color, UA yellow     Clarity, UA cloudy     Glucose, UA negative     Bilirubin, UA negative     Ketones, UA negative     Spec Grav, UA 1.020     Blood, UA negative     pH, UA 7.0     Protein, UA negative     Urobilinogen, UA 0.2     Nitrite,  UA negative     Leukocytes, UA Negative    POCT WET PREP WITH KOH      Result Value Ref Range   Trichomonas, UA Negative     Clue Cells Wet Prep HPF POC negative     Epithelial Wet Prep HPF POC 5-7     Yeast Wet Prep HPF POC negative     Bacteria Wet Prep HPF POC 1+     RBC Wet Prep HPF POC 0-1     WBC Wet Prep HPF POC 0-1     KOH Prep POC Negative      Assessment and Plan: UTI (urinary tract infection) - Plan: POCT UA - Microscopic Only, POCT urinalysis dipstick, Urine culture, POCT Wet Prep with KOH  Await repeat urine culture.  Her urine looks much better.  We have discussed her "TNTC" rbcs on her last UA with a borderline urine culture.  It is possible this  Could represent bladder or renal cancer. However at this time her urine looks much better.  Will await her follow- up culture.  At this time she does not wish to have further w/u per urology  Signed Lamar Blinks, MD

## 2014-01-04 LAB — URINE CULTURE
Colony Count: NO GROWTH
Organism ID, Bacteria: NO GROWTH

## 2014-01-05 ENCOUNTER — Encounter: Payer: Self-pay | Admitting: Family Medicine

## 2014-01-22 ENCOUNTER — Encounter: Payer: Self-pay | Admitting: Physician Assistant

## 2014-02-17 ENCOUNTER — Telehealth: Payer: Self-pay

## 2014-02-17 DIAGNOSIS — F988 Other specified behavioral and emotional disorders with onset usually occurring in childhood and adolescence: Secondary | ICD-10-CM

## 2014-02-17 NOTE — Telephone Encounter (Signed)
REFILL  amphetamine-dextroamphetamine (ADDERALL) 20 MG tablet   [phone]

## 2014-02-18 MED ORDER — AMPHETAMINE-DEXTROAMPHETAMINE 20 MG PO TABS: ORAL_TABLET | ORAL | Status: DC

## 2014-02-18 NOTE — Telephone Encounter (Signed)
Notified pt ready. 

## 2014-02-18 NOTE — Telephone Encounter (Signed)
Ready to pick up.  

## 2014-02-24 ENCOUNTER — Other Ambulatory Visit: Payer: Self-pay | Admitting: Physician Assistant

## 2014-03-02 ENCOUNTER — Encounter: Payer: Self-pay | Admitting: Physician Assistant

## 2014-03-02 ENCOUNTER — Other Ambulatory Visit: Payer: Self-pay | Admitting: Physician Assistant

## 2014-03-02 ENCOUNTER — Ambulatory Visit (INDEPENDENT_AMBULATORY_CARE_PROVIDER_SITE_OTHER): Payer: BC Managed Care – PPO | Admitting: Physician Assistant

## 2014-03-02 VITALS — BP 132/81 | HR 91 | Temp 98.3°F | Resp 16 | Ht 68.0 in | Wt 133.0 lb

## 2014-03-02 DIAGNOSIS — F988 Other specified behavioral and emotional disorders with onset usually occurring in childhood and adolescence: Secondary | ICD-10-CM

## 2014-03-02 DIAGNOSIS — IMO0001 Reserved for inherently not codable concepts without codable children: Secondary | ICD-10-CM

## 2014-03-02 DIAGNOSIS — N3281 Overactive bladder: Secondary | ICD-10-CM

## 2014-03-02 DIAGNOSIS — F329 Major depressive disorder, single episode, unspecified: Secondary | ICD-10-CM

## 2014-03-02 DIAGNOSIS — E039 Hypothyroidism, unspecified: Secondary | ICD-10-CM

## 2014-03-02 DIAGNOSIS — Z13220 Encounter for screening for lipoid disorders: Secondary | ICD-10-CM

## 2014-03-02 DIAGNOSIS — Z0000 Encounter for general adult medical examination without abnormal findings: Secondary | ICD-10-CM

## 2014-03-02 DIAGNOSIS — F339 Major depressive disorder, recurrent, unspecified: Secondary | ICD-10-CM

## 2014-03-02 DIAGNOSIS — Z01419 Encounter for gynecological examination (general) (routine) without abnormal findings: Secondary | ICD-10-CM

## 2014-03-02 LAB — POCT URINALYSIS DIPSTICK
BILIRUBIN UA: NEGATIVE
Blood, UA: NEGATIVE
GLUCOSE UA: NEGATIVE
Ketones, UA: NEGATIVE
Leukocytes, UA: NEGATIVE
NITRITE UA: NEGATIVE
PH UA: 6.5
Protein, UA: NEGATIVE
Spec Grav, UA: 1.015
Urobilinogen, UA: 0.2

## 2014-03-02 MED ORDER — NORGESTIM-ETH ESTRAD TRIPHASIC 0.18/0.215/0.25 MG-35 MCG PO TABS: ORAL_TABLET | ORAL | Status: DC

## 2014-03-02 MED ORDER — DIVALPROEX SODIUM ER 500 MG PO TB24: 1000.0000 mg | ORAL_TABLET | Freq: Every day | ORAL | Status: DC

## 2014-03-02 MED ORDER — ESCITALOPRAM OXALATE 10 MG PO TABS: 10.0000 mg | ORAL_TABLET | Freq: Every day | ORAL | Status: DC

## 2014-03-02 MED ORDER — MIRABEGRON ER 25 MG PO TB24: 25.0000 mg | ORAL_TABLET | Freq: Every day | ORAL | Status: DC

## 2014-03-02 NOTE — Patient Instructions (Signed)
I will contact you with your lab results as soon as they are available.   If you have not heard from me in 2 weeks, please contact me.  The fastest way to get your results is to register for My Chart (see the instructions on the last page of this printout).   

## 2014-03-02 NOTE — Progress Notes (Signed)
Subjective:    Patient ID: Kerri Murphy, female    DOB: 1965-11-22, 48 y.o.   MRN: [id number]  HPI Pt presents to clinic for CPE.  She has been doing well.  She has a few questions about her medications. She is still having bad headaches with Zoloft - she decreased her dose of 100mg  to 50mg  and that has not helped the headaches.  She knows she has been on a lot of meds in the past and the Zoloft worked the best but she cannot deal with the headaches any longer.  She is stable on the Depakote and her mood has been stable for several months.  She sees therapist Lucita Ferrara. She has overactive bladder and has been on Toviaz successfully for years but her insurance is no longer covering the medication.  She got a letter stating that she can use [place], Vesicare, Detrol, ditropan or Myrbetriq).  She has been on ditropan in the past and it caused bad side effects. She does not really want to go to the specialist because he spends no time with her and just gives her a Rx.  She has not had a pap in years.  She had an abnl in the past but it was many paps ago.  She is not interested in getting a mammogram - she does her self breast exams and does not have a family history of breast cancer.  Review of Systems  Constitutional: Negative.   HENT: Negative.   Eyes: Negative.   Respiratory: Negative.   Cardiovascular: Negative.   Gastrointestinal: Negative.   Endocrine: Negative.   Genitourinary: Negative.  Negative for urgency (not when she is on her medications).  Musculoskeletal: Negative.   Skin: Negative.   Allergic/Immunologic: Negative.   Neurological: Negative.   Hematological: Negative.   Psychiatric/Behavioral: Positive for dysphoric mood (good for her at this time).       Objective:   Physical Exam  Vitals reviewed. Constitutional: She is oriented to person, place, and time. She appears well-developed and well-nourished.  HENT:  Head: Normocephalic and atraumatic.  Right Ear:  Hearing, tympanic membrane, external ear and ear canal normal.  Left Ear: Hearing, tympanic membrane, external ear and ear canal normal.  Nose: Nose normal.  Mouth/Throat: Uvula is midline, oropharynx is clear and moist and mucous membranes are normal.  Eyes: Conjunctivae and EOM are normal. Pupils are equal, round, and reactive to light.  Neck: Normal range of motion. Neck supple. No thyromegaly present.  Cardiovascular: Normal rate, regular rhythm and normal heart sounds.   No murmur heard. Pulmonary/Chest: Effort normal and breath sounds normal. She has no wheezes.  Abdominal: Soft. Bowel sounds are normal.  Genitourinary: Vagina normal and uterus normal. No breast swelling, tenderness, discharge or bleeding. Pelvic exam was performed with patient supine. No labial fusion. There is no rash, tenderness, lesion or injury on the right labia. There is no rash, tenderness, lesion or injury on the left labia. Cervix exhibits no motion tenderness, no discharge and no friability. Right adnexum displays no mass, no tenderness and no fullness. Left adnexum displays no mass, no tenderness and no fullness.  Musculoskeletal: Normal range of motion.  Lymphadenopathy:       Right: No inguinal adenopathy present.       Left: No inguinal adenopathy present.  Neurological: She is alert and oriented to person, place, and time. She has normal reflexes.  Skin: Skin is warm and dry.  Psychiatric: She has a normal mood and affect. Her  behavior is normal. Judgment and thought content normal.       Assessment & Plan:  Depression, major, recurrent - Plan: divalproex (DEPAKOTE ER) 500 MG 24 hr tablet, escitalopram (LEXAPRO) 10 MG tablet  Overactive bladder - She has a month left of her Toviaz and then we will try this medication.  It has side effect of elevating BP so she will RTC after she has been on it about 1 month and monitor her BP from when she starts it until then.  If it does not help or she has SE we will  make a plan at that time.  Plan: mirabegron ER (MYRBETRIQ) 25 MG TB24 tablet  Birth control - Plan: Norgestimate-Ethinyl Estradiol Triphasic (TRINESSA, 28,) 0.18/0.215/0.25 MG-35 MCG tablet  Major depressive disorder - We are going to d/c the Zoloft because of the headaches and start lexapro - She will monitor over the next month her headaches.  Plan: escitalopram (LEXAPRO) 10 MG tablet  Annual physical exam - Plan: CBC with Differential, COMPLETE METABOLIC PANEL WITH GFR, POCT urinalysis dipstick  Encounter for routine gynecological examination - Plan: Pap IG and HPV (high risk) DNA detection  ADD - well controlled on current medications.  Windell Hummingbird PA-C  [facility] 03/02/2014 5:15 PM

## 2014-03-03 LAB — COMPLETE METABOLIC PANEL WITH GFR
ALT: 15 U/L (ref 0–35)
AST: 19 U/L (ref 0–37)
Albumin: 4 g/dL (ref 3.5–5.2)
Alkaline Phosphatase: 48 U/L (ref 39–117)
BUN: 14 mg/dL (ref 6–23)
CALCIUM: 9.4 mg/dL (ref 8.4–10.5)
CHLORIDE: 97 meq/L (ref 96–112)
CO2: 32 mEq/L (ref 19–32)
CREATININE: 0.88 mg/dL (ref 0.50–1.10)
GFR, Est African American: >89 mL/min
GFR, Est Non African American: 78 mL/min
Glucose, Bld: 95 mg/dL (ref 70–99)
Potassium: 4.3 mEq/L (ref 3.5–5.3)
Sodium: 136 mEq/L (ref 135–145)
Total Bilirubin: 0.3 mg/dL (ref 0.2–1.2)
Total Protein: 7.3 g/dL (ref 6.0–8.3)

## 2014-03-03 LAB — CBC WITH DIFFERENTIAL/PLATELET
BASOS ABS: 0.1 10*3/uL (ref 0.0–0.1)
Basophils Relative: 1 % (ref 0–1)
EOS PCT: 1 % (ref 0–5)
Eosinophils Absolute: 0.1 10*3/uL (ref 0.0–0.7)
HCT: 37.1 % (ref 36.0–46.0)
Hemoglobin: 12.4 g/dL (ref 12.0–15.0)
LYMPHS ABS: 2.1 10*3/uL (ref 0.7–4.0)
LYMPHS PCT: 41 % (ref 12–46)
MCH: 29.5 pg (ref 26.0–34.0)
MCHC: 33.4 g/dL (ref 30.0–36.0)
MCV: 88.1 fL (ref 78.0–100.0)
Monocytes Absolute: 0.4 10*3/uL (ref 0.1–1.0)
Monocytes Relative: 7 % (ref 3–12)
NEUTROS PCT: 50 % (ref 43–77)
Neutro Abs: 2.6 10*3/uL (ref 1.7–7.7)
PLATELETS: 238 10*3/uL (ref 150–400)
RBC: 4.21 MIL/uL (ref 3.87–5.11)
RDW: 12.8 % (ref 11.5–15.5)
WBC: 5.2 10*3/uL (ref 4.0–10.5)

## 2014-03-03 LAB — PAP IG AND HPV HIGH-RISK: HPV DNA HIGH RISK: NOT DETECTED

## 2014-03-04 ENCOUNTER — Other Ambulatory Visit: Payer: Self-pay | Admitting: Physician Assistant

## 2014-03-14 ENCOUNTER — Encounter: Payer: Self-pay | Admitting: *Deleted

## 2014-04-06 ENCOUNTER — Encounter: Payer: Self-pay | Admitting: Physician Assistant

## 2014-04-07 NOTE — Telephone Encounter (Signed)
Called pt to clarify details of trials of other meds. Pt tried ditropan in past and had intolerable dry mouth. She started the mirabegron that Judson Roch Rxd last week and it has not been at all effective. She has also had increased fatigue and runny nose on it. I completed PA form on covermymeds.

## 2014-04-12 NOTE — Telephone Encounter (Signed)
Spoke with pt. She had no new SE to add. Let her know that we sent in the request on her PA for Toviaz on 04/07/14 and that it can tak 3-5 business days to process and that we would call her when we got the approval.

## 2014-04-19 MED ORDER — FESOTERODINE FUMARATE ER 8 MG PO TB24: 8.0000 mg | ORAL_TABLET | Freq: Every day | ORAL | Status: DC

## 2014-04-19 NOTE — Addendum Note (Signed)
Addended by: Elwyn Reach A on: 04/19/2014 02:27 PM   Modules accepted: Orders

## 2014-04-19 NOTE — Telephone Encounter (Signed)
Called [facility] to check status of PA since we have not heard back. PA approved through 04/19/15. Notified pharm and pt on VM and MyChart.

## 2014-04-19 NOTE — Telephone Encounter (Signed)
Sent MyChart message to pt after getting call from pharm. See note.

## 2014-04-19 NOTE — Telephone Encounter (Signed)
Pt stated that Kerri Murphy had put pt on Myrbetriq to try instead of Toviaz when it looked like ins would not cover it and it is not working. She stated that Kerri Murphy advised her that she would Rx the [place] or other med in class for pt since she hasn't had any changes in condition and does not wish to keep going to specialist regularly. Pt reqs 1 mos Rx be sent to [facility] so that she can get started right away, and then a 90 day to [facility]. Can someone else OK this in Kerri Murphy's absence?

## 2014-04-19 NOTE — Telephone Encounter (Signed)
Notified pt that 30 day sent to [facility]. Pt was thankful. She will ask for a 90 day to [facility] from [place] at her f/up visit next week.

## 2014-04-23 ENCOUNTER — Other Ambulatory Visit: Payer: Self-pay | Admitting: Physician Assistant

## 2014-04-26 ENCOUNTER — Other Ambulatory Visit: Payer: Self-pay

## 2014-04-26 MED ORDER — ESCITALOPRAM OXALATE 10 MG PO TABS: 10.0000 mg | ORAL_TABLET | Freq: Every day | ORAL | Status: DC

## 2014-04-26 MED ORDER — FESOTERODINE FUMARATE ER 8 MG PO TB24: 8.0000 mg | ORAL_TABLET | Freq: Every day | ORAL | Status: DC

## 2014-04-26 MED ORDER — NORGESTIM-ETH ESTRAD TRIPHASIC 0.18/0.215/0.25 MG-35 MCG PO TABS: ORAL_TABLET | ORAL | Status: DC

## 2014-04-26 MED ORDER — DIVALPROEX SODIUM ER 500 MG PO TB24: 1000.0000 mg | ORAL_TABLET | Freq: Every day | ORAL | Status: DC

## 2014-04-27 ENCOUNTER — Ambulatory Visit (INDEPENDENT_AMBULATORY_CARE_PROVIDER_SITE_OTHER): Payer: BC Managed Care – PPO | Admitting: Physician Assistant

## 2014-04-27 VITALS — BP 117/76 | HR 88 | Temp 98.7°F | Resp 16 | Ht 68.0 in | Wt 138.0 lb

## 2014-04-27 DIAGNOSIS — N3281 Overactive bladder: Secondary | ICD-10-CM

## 2014-04-27 DIAGNOSIS — F331 Major depressive disorder, recurrent, moderate: Secondary | ICD-10-CM

## 2014-04-27 DIAGNOSIS — N318 Other neuromuscular dysfunction of bladder: Secondary | ICD-10-CM

## 2014-04-27 DIAGNOSIS — F988 Other specified behavioral and emotional disorders with onset usually occurring in childhood and adolescence: Secondary | ICD-10-CM

## 2014-04-27 MED ORDER — AMPHETAMINE-DEXTROAMPHETAMINE 20 MG PO TABS: ORAL_TABLET | ORAL | Status: DC

## 2014-04-27 NOTE — Progress Notes (Signed)
   Subjective:    Patient ID: Kerri Murphy, female    DOB: 1966/10/05, 48 y.o.   MRN: [id number]  HPI Pt presents to clinic for a recheck.  She tried the Myrbetriq but it did not worked - she immediately started to leak and got terribly fatigued and felt her depression worsening.  She has been back on the [place] after her PA went through with her insurance company.  Since stopping the Zoloft she has had no headaches and improved fatigue and her depression is better than it has been in a long time.  She is very happy with the results.  Review of Systems     Objective:   Physical Exam  Vitals reviewed. Constitutional: She appears well-developed and well-nourished.  HENT:  Head: Normocephalic and atraumatic.  Right Ear: External ear normal.  Left Ear: External ear normal.  Pulmonary/Chest: Effort normal.  Skin: Skin is warm and dry.  Psychiatric: She has a normal mood and affect. Her behavior is normal. Judgment and thought content normal.      Assessment & Plan:  ADD (attention deficit disorder) - Plan: amphetamine-dextroamphetamine (ADDERALL) 20 MG tablet - can refill monthly for 6 months and then she will need an OV.  Major depressive disorder, recurrent episode, moderate - continue current medications - doing well  Overactive bladder - continue current medications - doing well     Windell Hummingbird PA-C  [facility] 04/27/2014 4:46 PM

## 2014-06-02 ENCOUNTER — Telehealth: Payer: Self-pay

## 2014-06-02 NOTE — Telephone Encounter (Signed)
Pt reported she started out at 1/2 the current dose BID. Completed PA on phone and received approval through 06/02/15. Notified pharm and pt.

## 2014-06-02 NOTE — Telephone Encounter (Signed)
PA needed for Adderall 20 mg, 2 tabs BID. I don't see any other meds prev tried for ADD in our records. LMOM for pt to CB to advise h/o ADD meds/dosages.

## 2014-09-14 ENCOUNTER — Telehealth: Payer: Self-pay | Admitting: Physician Assistant

## 2014-09-14 DIAGNOSIS — F988 Other specified behavioral and emotional disorders with onset usually occurring in childhood and adolescence: Secondary | ICD-10-CM

## 2014-09-15 MED ORDER — AMPHETAMINE-DEXTROAMPHETAMINE 20 MG PO TABS: 20.0000 mg | ORAL_TABLET | Freq: Two times a day (BID) | ORAL | Status: DC

## 2014-09-15 NOTE — Telephone Encounter (Signed)
Rx ready - pt knows by mychart.

## 2014-09-16 NOTE — Telephone Encounter (Signed)
Rx in drawer. 

## 2014-09-30 ENCOUNTER — Other Ambulatory Visit: Payer: Self-pay | Admitting: Physician Assistant

## 2014-10-06 ENCOUNTER — Other Ambulatory Visit: Payer: Self-pay | Admitting: Physician Assistant

## 2014-10-20 ENCOUNTER — Encounter: Payer: Self-pay | Admitting: Physician Assistant

## 2014-10-23 ENCOUNTER — Other Ambulatory Visit: Payer: Self-pay | Admitting: Physician Assistant

## 2014-10-26 ENCOUNTER — Ambulatory Visit: Payer: BC Managed Care – PPO | Admitting: Physician Assistant

## 2014-10-27 ENCOUNTER — Other Ambulatory Visit: Payer: Self-pay | Admitting: Physician Assistant

## 2014-10-27 ENCOUNTER — Encounter: Payer: Self-pay | Admitting: Physician Assistant

## 2014-10-27 MED ORDER — ESCITALOPRAM OXALATE 20 MG PO TABS: 20.0000 mg | ORAL_TABLET | Freq: Every day | ORAL | Status: DC

## 2014-12-05 ENCOUNTER — Encounter: Payer: Self-pay | Admitting: Family Medicine

## 2014-12-06 MED ORDER — ESCITALOPRAM OXALATE 20 MG PO TABS: 20.0000 mg | ORAL_TABLET | Freq: Every day | ORAL | Status: DC

## 2014-12-06 NOTE — Telephone Encounter (Signed)
I will be happy to do this for the patient.

## 2014-12-06 NOTE — Telephone Encounter (Signed)
-----   Message from Darreld Mclean, MD sent at 12/05/2014  3:58 PM EST ----- I thinks she sent this to me by mistake:  The increased dosage of Lexapro has been very effective. Would it be possible for you to send in a prescription at this dosage for a 90 day supply to [facility] mail order? Thanks. Maudie Mercury

## 2014-12-29 ENCOUNTER — Other Ambulatory Visit: Payer: Self-pay | Admitting: Physician Assistant

## 2014-12-30 ENCOUNTER — Other Ambulatory Visit: Payer: Self-pay | Admitting: Physician Assistant

## 2014-12-30 ENCOUNTER — Encounter: Payer: Self-pay | Admitting: Physician Assistant

## 2014-12-30 DIAGNOSIS — F988 Other specified behavioral and emotional disorders with onset usually occurring in childhood and adolescence: Secondary | ICD-10-CM

## 2014-12-31 MED ORDER — AMPHETAMINE-DEXTROAMPHETAMINE 20 MG PO TABS: 20.0000 mg | ORAL_TABLET | Freq: Two times a day (BID) | ORAL | Status: DC

## 2014-12-31 NOTE — Telephone Encounter (Signed)
Rx ready for patient = she knows through my chart

## 2015-01-02 NOTE — Telephone Encounter (Signed)
Rx in drawer. 

## 2015-01-07 ENCOUNTER — Other Ambulatory Visit: Payer: Self-pay | Admitting: Physician Assistant

## 2015-01-11 ENCOUNTER — Other Ambulatory Visit: Payer: Self-pay | Admitting: Physician Assistant

## 2015-01-12 NOTE — Telephone Encounter (Signed)
Kerri Roch, do you want to OK this and other chronic meds until pt's appt 03/08/15 (90 day bc it's mail order)?

## 2015-02-13 ENCOUNTER — Other Ambulatory Visit: Payer: Self-pay | Admitting: Physician Assistant

## 2015-02-13 ENCOUNTER — Encounter: Payer: Self-pay | Admitting: Physician Assistant

## 2015-02-14 NOTE — Telephone Encounter (Signed)
Kerri Murphy, the reason pt only got a 30 day supply last time was because she was overdue for f/up. Note was put on RF. Pt did set up appt for 03/08/15. (see current RF request enc also).

## 2015-02-14 NOTE — Telephone Encounter (Signed)
Kerri Murphy, pt has appt scheduled for 03/08/15. Do you want to approve 90 day RF to [facility]? See pt email.

## 2015-02-15 ENCOUNTER — Encounter: Payer: Self-pay | Admitting: Physician Assistant

## 2015-02-15 DIAGNOSIS — F988 Other specified behavioral and emotional disorders with onset usually occurring in childhood and adolescence: Secondary | ICD-10-CM

## 2015-02-17 MED ORDER — AMPHETAMINE-DEXTROAMPHETAMINE 20 MG PO TABS: 20.0000 mg | ORAL_TABLET | Freq: Two times a day (BID) | ORAL | Status: DC

## 2015-02-17 NOTE — Telephone Encounter (Signed)
Notified pt that rx ready for pick up.

## 2015-02-17 NOTE — Telephone Encounter (Signed)
Rx ready - pt knows through mychart.

## 2015-03-01 ENCOUNTER — Other Ambulatory Visit: Payer: Self-pay | Admitting: Physician Assistant

## 2015-03-08 ENCOUNTER — Encounter: Payer: Self-pay | Admitting: Physician Assistant

## 2015-03-08 ENCOUNTER — Ambulatory Visit (INDEPENDENT_AMBULATORY_CARE_PROVIDER_SITE_OTHER): Payer: BC Managed Care – PPO | Admitting: Physician Assistant

## 2015-03-08 VITALS — BP 102/60 | HR 96 | Temp 98.3°F | Resp 16 | Ht 68.25 in | Wt 137.2 lb

## 2015-03-08 DIAGNOSIS — Z13220 Encounter for screening for lipoid disorders: Secondary | ICD-10-CM

## 2015-03-08 DIAGNOSIS — Z3041 Encounter for surveillance of contraceptive pills: Secondary | ICD-10-CM

## 2015-03-08 DIAGNOSIS — F331 Major depressive disorder, recurrent, moderate: Secondary | ICD-10-CM

## 2015-03-08 DIAGNOSIS — N3281 Overactive bladder: Secondary | ICD-10-CM

## 2015-03-08 DIAGNOSIS — E031 Congenital hypothyroidism without goiter: Secondary | ICD-10-CM | POA: Diagnosis not present

## 2015-03-08 DIAGNOSIS — Z13228 Encounter for screening for other metabolic disorders: Secondary | ICD-10-CM | POA: Diagnosis not present

## 2015-03-08 DIAGNOSIS — Z0000 Encounter for general adult medical examination without abnormal findings: Secondary | ICD-10-CM | POA: Diagnosis not present

## 2015-03-08 DIAGNOSIS — Z130 Encounter for screening for diseases of the blood and blood-forming organs and certain disorders involving the immune mechanism: Secondary | ICD-10-CM

## 2015-03-08 DIAGNOSIS — F909 Attention-deficit hyperactivity disorder, unspecified type: Secondary | ICD-10-CM

## 2015-03-08 DIAGNOSIS — F988 Other specified behavioral and emotional disorders with onset usually occurring in childhood and adolescence: Secondary | ICD-10-CM

## 2015-03-08 LAB — LIPID PANEL
CHOL/HDL RATIO: 2.5 ratio
CHOLESTEROL: 190 mg/dL (ref 0–200)
HDL: 75 mg/dL (ref >=46)
LDL Cholesterol: 95 mg/dL (ref 0–99)
Triglycerides: 100 mg/dL (ref <150)
VLDL: 20 mg/dL (ref 0–40)

## 2015-03-08 LAB — CBC WITH DIFFERENTIAL/PLATELET
BASOS ABS: 0.1 10*3/uL (ref 0.0–0.1)
Basophils Relative: 1 % (ref 0–1)
EOS ABS: 0.1 10*3/uL (ref 0.0–0.7)
EOS PCT: 1 % (ref 0–5)
HCT: 36.3 % (ref 36.0–46.0)
HEMOGLOBIN: 12.1 g/dL (ref 12.0–15.0)
LYMPHS ABS: 1.7 10*3/uL (ref 0.7–4.0)
LYMPHS PCT: 33 % (ref 12–46)
MCH: 29.7 pg (ref 26.0–34.0)
MCHC: 33.3 g/dL (ref 30.0–36.0)
MCV: 89 fL (ref 78.0–100.0)
MONO ABS: 0.5 10*3/uL (ref 0.1–1.0)
MONOS PCT: 10 % (ref 3–12)
MPV: 10.1 fL (ref 8.6–12.4)
Neutro Abs: 2.9 10*3/uL (ref 1.7–7.7)
Neutrophils Relative %: 55 % (ref 43–77)
Platelets: 239 10*3/uL (ref 150–400)
RBC: 4.08 MIL/uL (ref 3.87–5.11)
RDW: 13 % (ref 11.5–15.5)
WBC: 5.3 10*3/uL (ref 4.0–10.5)

## 2015-03-08 LAB — COMPLETE METABOLIC PANEL WITH GFR
ALT: 11 U/L (ref 0–35)
AST: 16 U/L (ref 0–37)
Albumin: 3.6 g/dL (ref 3.5–5.2)
Alkaline Phosphatase: 43 U/L (ref 39–117)
BILIRUBIN TOTAL: 0.3 mg/dL (ref 0.2–1.2)
BUN: 14 mg/dL (ref 6–23)
CHLORIDE: 104 meq/L (ref 96–112)
CO2: 28 mEq/L (ref 19–32)
Calcium: 9 mg/dL (ref 8.4–10.5)
Creat: 0.86 mg/dL (ref 0.50–1.10)
GFR, Est African American: >89 mL/min
GFR, Est Non African American: 80 mL/min
GLUCOSE: 99 mg/dL (ref 70–99)
POTASSIUM: 4.6 meq/L (ref 3.5–5.3)
Sodium: 136 mEq/L (ref 135–145)
Total Protein: 6.5 g/dL (ref 6.0–8.3)

## 2015-03-08 MED ORDER — FESOTERODINE FUMARATE ER 8 MG PO TB24: 8.0000 mg | ORAL_TABLET | Freq: Every day | ORAL | Status: DC

## 2015-03-08 MED ORDER — DIVALPROEX SODIUM ER 500 MG PO TB24: 1000.0000 mg | ORAL_TABLET | Freq: Every day | ORAL | Status: DC

## 2015-03-08 MED ORDER — AMPHETAMINE-DEXTROAMPHETAMINE 20 MG PO TABS: 20.0000 mg | ORAL_TABLET | Freq: Two times a day (BID) | ORAL | Status: DC

## 2015-03-08 MED ORDER — NORGESTIM-ETH ESTRAD TRIPHASIC 0.18/0.215/0.25 MG-35 MCG PO TABS: 1.0000 | ORAL_TABLET | Freq: Every day | ORAL | Status: DC

## 2015-03-08 MED ORDER — ESCITALOPRAM OXALATE 20 MG PO TABS: 30.0000 mg | ORAL_TABLET | Freq: Every day | ORAL | Status: DC

## 2015-03-08 NOTE — Progress Notes (Signed)
Subjective:    Patient ID: Kerri Murphy, female    DOB: July 05, 1966, 49 y.o.   MRN: [id number]  HPI Pt presents to clinic for her CPE.  She is doing well.  She is slightly worried that her depression is worse than it has been.  She made it through the winter but her depressed mood from the winter has not lifted.  She still see therapy but she was wondering if she could have an increase in her Lexapro.  She did not tolerate the increase in depakote well because it caused diarrhea.  Vision- 1 year ago Dental - appt in a month Declines mammogram -  Vaccination UTD  Review of Systems  Constitutional: Negative.   HENT: Negative.   Eyes: Negative.   Respiratory: Negative.   Cardiovascular: Negative.   Gastrointestinal: Negative.   Endocrine: Negative.   Genitourinary: Positive for urgency (improved with medication).  Musculoskeletal: Negative.   Skin: Negative.   Allergic/Immunologic: Negative.   Neurological: Negative.   Hematological: Negative.   Psychiatric/Behavioral: Positive for dysphoric mood.   Patient Active Problem List   Diagnosis Date Noted  . Overactive bladder - well controlled with her medications 10/06/2012  . Congenital hypothyroidism without goiter - sees endocrinology for treatment 02/18/2012  . ADD (attention deficit disorder) - Adderall works well 02/17/2012  . Major depressive disorder - treatment 11/17/2006  . DYSPLASIA, CERVIX, MILD - normal pap smear last year 11/07/2006   Prior to Admission medications   Medication Sig Start Date End Date Taking? Authorizing Provider  amphetamine-dextroamphetamine (ADDERALL) 20 MG tablet Take 1 tablet (20 mg total) by mouth 2 (two) times daily. Two tablets twice a day 02/17/15  Yes Sarah Alleen Borne, PA-C  CRAN[place]Y PO Take by mouth daily.   Yes Historical Provider, MD  divalproex (DEPAKOTE ER) 500 MG 24 hr tablet TAKE 2 TABLETS (1,000 MG TOTAL) DAILY (NEED OFFICE VISIT FOR ADDITIONAL REFILLS) 01/12/15  Yes Mancel Bale,  PA-C  escitalopram (LEXAPRO) 20 MG tablet Take 1 tablet (20 mg total) by mouth daily. 12/06/14  Yes Mancel Bale, PA-C  levothyroxine (SYNTHROID, LEVOTHROID) 88 MCG tablet Take 88 mcg by mouth daily before breakfast.   Yes Historical Provider, MD  Multiple Vitamin (MULTIVITAMIN) tablet Take 1 tablet by mouth daily.   Yes Historical Provider, MD  Norgestimate-Ethinyl Estradiol Triphasic 0.18/0.215/0.25 MG-35 MCG tablet Take 1 tablet by mouth daily. NO MORE REFILLS WITHOUT OFFICE VISIT - 2ND NOTICE 03/01/15  Yes Mancel Bale, PA-C  TOVIAZ 8 MG TB24 tablet TAKE 1 TABLET DAILY (NEEDS OFFICE VISIT FOR ADDITIONAL REFILLS) 02/15/15  Yes Mancel Bale, PA-C   Allergies  Allergen Reactions  . Myrbetriq [Mirabegron] Anaphylaxis    Fatigue, HA, increased depression and did not work    Medications, allergies, past medical history, surgical history, family history, social history and problem list reviewed and updated.      Objective:   Physical Exam  Constitutional: She is oriented to person, place, and time. She appears well-developed and well-nourished.  BP 102/60 mmHg  Pulse 96  Temp(Src) 98.3 F (36.8 C) (Oral)  Resp 16  Ht 5' 8.25" (1.734 m)  Wt 137 lb 3.2 oz (62.234 kg)  BMI 20.70 kg/m2  SpO2 99%  LMP 02/20/2015   HENT:  Head: Normocephalic and atraumatic.  Right Ear: Hearing, tympanic membrane, external ear and ear canal normal.  Left Ear: Hearing, tympanic membrane, external ear and ear canal normal.  Nose: Nose normal.  Mouth/Throat: Uvula is midline, oropharynx is  clear and moist and mucous membranes are normal.  Eyes: Conjunctivae and EOM are normal. Pupils are equal, round, and reactive to light.  Neck: Trachea normal and normal range of motion. Neck supple. No thyroid mass and no thyromegaly present.  Cardiovascular: Normal rate, regular rhythm and normal heart sounds.   No murmur heard. Pulmonary/Chest: Effort normal and breath sounds normal. She has no wheezes.  Abdominal:  Soft. Bowel sounds are normal.  Genitourinary: No breast swelling, tenderness, discharge or bleeding. Pelvic exam was performed with patient supine.  Musculoskeletal: Normal range of motion.  Lymphadenopathy:    She has no cervical adenopathy.  Neurological: She is alert and oriented to person, place, and time. She has normal reflexes.  Skin: Skin is warm and dry.  Psychiatric: Her behavior is normal. Judgment and thought content normal.  Flat affect - normal for her   Results for orders placed or performed in visit on 03/08/15  CBC with Differential/Platelet  Result Value Ref Range   WBC 5.3 4.0 - 10.5 K/uL   RBC 4.08 3.87 - 5.11 MIL/uL   Hemoglobin 12.1 12.0 - 15.0 g/dL   HCT 36.3 36.0 - 46.0 %   MCV 89.0 78.0 - 100.0 fL   MCH 29.7 26.0 - 34.0 pg   MCHC 33.3 30.0 - 36.0 g/dL   RDW 13.0 11.5 - 15.5 %   Platelets 239 150 - 400 K/uL   MPV 10.1 8.6 - 12.4 fL   Neutrophils Relative % 55 43 - 77 %   Neutro Abs 2.9 1.7 - 7.7 K/uL   Lymphocytes Relative 33 12 - 46 %   Lymphs Abs 1.7 0.7 - 4.0 K/uL   Monocytes Relative 10 3 - 12 %   Monocytes Absolute 0.5 0.1 - 1.0 K/uL   Eosinophils Relative 1 0 - 5 %   Eosinophils Absolute 0.1 0.0 - 0.7 K/uL   Basophils Relative 1 0 - 1 %   Basophils Absolute 0.1 0.0 - 0.1 K/uL   Smear Review Criteria for review not met   COMPLETE METABOLIC PANEL WITH GFR  Result Value Ref Range   Sodium 136 135 - 145 mEq/L   Potassium 4.6 3.5 - 5.3 mEq/L   Chloride 104 96 - 112 mEq/L   CO2 28 19 - 32 mEq/L   Glucose, Bld 99 70 - 99 mg/dL   BUN 14 6 - 23 mg/dL   Creat 0.86 0.50 - 1.10 mg/dL   Total Bilirubin 0.3 0.2 - 1.2 mg/dL   Alkaline Phosphatase 43 39 - 117 U/L   AST 16 0 - 37 U/L   ALT 11 0 - 35 U/L   Total Protein 6.5 6.0 - 8.3 g/dL   Albumin 3.6 3.5 - 5.2 g/dL   Calcium 9.0 8.4 - 10.5 mg/dL   GFR, Est African American >89 mL/min   GFR, Est Non African American 80 mL/min  Lipid panel  Result Value Ref Range   Cholesterol 190 0 - 200 mg/dL    Triglycerides 100 <150 mg/dL   HDL 75 >=46 mg/dL   Total CHOL/HDL Ratio 2.5 Ratio   VLDL 20 0 - 40 mg/dL   LDL Cholesterol 95 0 - 99 mg/dL  Valproic acid level  Result Value Ref Range   Valproic Acid Lvl 63.4 50.0 - 100.0 ug/mL       Assessment & Plan:  Congenital hypothyroidism without goiter - continue with endo for this f/u.  Annual physical exam - pt declines mammogram  Overactive bladder - Continue  current medications. Plan: fesoterodine (TOVIAZ) 8 MG TB24 tablet  Major depressive disorder, recurrent episode, moderate - Plan: Valproic acid level, divalproex (DEPAKOTE ER) 500 MG 24 hr tablet, escitalopram (LEXAPRO) 20 MG tablet - increase her Lexapro 61m and see how she does - she will recheck with me in about 3 months unless she has problems.  If she continues to feel down we may try to increase her depakote again and she if she still has the same SE.  ADD (attention deficit disorder) - continue current medications amphetamine-dextroamphetamine (ADDERALL) 20 MG tablet  Screening cholesterol level - Plan: Lipid panel  Screening for metabolic disorder - Plan: COMPLETE METABOLIC PANEL WITH GFR  Screening for deficiency anemia - Plan: CBC with Differential/Platelet  Encounter for surveillance of contraceptive pills - Plan: Norgestimate-Ethinyl Estradiol Triphasic 0.18/0.215/0.25 MG-35 MCG tablet  SWindell HummingbirdPA-C  [facility] 03/11/2015 8:06 AM

## 2015-03-09 LAB — VALPROIC ACID LEVEL: VALPROIC ACID LVL: 63.4 ug/mL (ref 50.0–100.0)

## 2015-04-21 ENCOUNTER — Telehealth: Payer: Self-pay

## 2015-04-21 NOTE — Telephone Encounter (Signed)
Fax from [facility] advised PA about to expire for [place]. Completed on covermymeds and approved through 04/20/16, case # [id number]. Pt had verified that she tried failed Myrbetriq and at least one other generic before Toviaz and failed both d/t intolerable SEs.

## 2015-05-25 ENCOUNTER — Encounter: Payer: Self-pay | Admitting: Physician Assistant

## 2015-05-25 DIAGNOSIS — F988 Other specified behavioral and emotional disorders with onset usually occurring in childhood and adolescence: Secondary | ICD-10-CM

## 2015-05-27 MED ORDER — AMPHETAMINE-DEXTROAMPHETAMINE 20 MG PO TABS: 20.0000 mg | ORAL_TABLET | Freq: Two times a day (BID) | ORAL | Status: DC

## 2015-05-27 NOTE — Telephone Encounter (Signed)
Done.  Pt knows through mychart 

## 2015-05-29 NOTE — Telephone Encounter (Signed)
Rx in drawer. 

## 2015-07-13 ENCOUNTER — Ambulatory Visit (INDEPENDENT_AMBULATORY_CARE_PROVIDER_SITE_OTHER): Payer: BC Managed Care – PPO | Admitting: Physician Assistant

## 2015-07-13 ENCOUNTER — Encounter: Payer: Self-pay | Admitting: Physician Assistant

## 2015-07-13 VITALS — BP 106/70 | HR 89 | Temp 98.4°F | Resp 16 | Wt 136.4 lb

## 2015-07-13 DIAGNOSIS — F909 Attention-deficit hyperactivity disorder, unspecified type: Secondary | ICD-10-CM | POA: Diagnosis not present

## 2015-07-13 DIAGNOSIS — R5383 Other fatigue: Secondary | ICD-10-CM

## 2015-07-13 DIAGNOSIS — R531 Weakness: Secondary | ICD-10-CM

## 2015-07-13 DIAGNOSIS — F988 Other specified behavioral and emotional disorders with onset usually occurring in childhood and adolescence: Secondary | ICD-10-CM

## 2015-07-13 LAB — COMPLETE METABOLIC PANEL WITH GFR
ALK PHOS: 40 U/L (ref 33–115)
ALT: 13 U/L (ref 6–29)
AST: 21 U/L (ref 10–35)
Albumin: 3.7 g/dL (ref 3.6–5.1)
BILIRUBIN TOTAL: 0.2 mg/dL (ref 0.2–1.2)
BUN: 13 mg/dL (ref 7–25)
CALCIUM: 9.2 mg/dL (ref 8.6–10.2)
CO2: 28 mmol/L (ref 20–31)
Chloride: 102 mmol/L (ref 98–110)
Creat: 0.85 mg/dL (ref 0.50–1.10)
GFR, Est African American: >89 mL/min (ref >=60)
GFR, Est Non African American: 81 mL/min (ref >=60)
GLUCOSE: 91 mg/dL (ref 65–99)
Potassium: 4.7 mmol/L (ref 3.5–5.3)
Sodium: 139 mmol/L (ref 135–146)
TOTAL PROTEIN: 6.6 g/dL (ref 6.1–8.1)

## 2015-07-13 LAB — POCT CBC
GRANULOCYTE PERCENT: 48.9 % (ref 37–80)
HEMATOCRIT: 38 % (ref 37.7–47.9)
HEMOGLOBIN: 11.9 g/dL — AB (ref 12.2–16.2)
Lymph, poc: 2.2 (ref 0.6–3.4)
MCH, POC: 28.2 pg (ref 27–31.2)
MCHC: 31.2 g/dL — AB (ref 31.8–35.4)
MCV: 90.3 fL (ref 80–97)
MID (cbc): 0.3 (ref 0–0.9)
MPV: 7.5 fL (ref 0–99.8)
PLATELET COUNT, POC: 223 10*3/uL (ref 142–424)
POC GRANULOCYTE: 2.4 (ref 2–6.9)
POC LYMPH PERCENT: 45.1 %L (ref 10–50)
POC MID %: 6 %M (ref 0–12)
RBC: 4.21 M/uL (ref 4.04–5.48)
RDW, POC: 13.1 %
WBC: 4.9 10*3/uL (ref 4.6–10.2)

## 2015-07-13 LAB — CK: CK TOTAL: 117 U/L (ref 7–177)

## 2015-07-13 MED ORDER — AMPHETAMINE-DEXTROAMPHETAMINE 20 MG PO TABS: 20.0000 mg | ORAL_TABLET | Freq: Two times a day (BID) | ORAL | Status: DC

## 2015-07-13 NOTE — Progress Notes (Signed)
Kerri Murphy  MRN: [id number] DOB: [birth year]  Subjective:  Pt presents to clinic with fatigue for a couple of months.  Pt feels like she is always tried and she has absolutely no energy.  She feels like it started several months ago but it has been getting progressively worse.  She is sleeping fine and does not snore or wake up gasping for air.  She has sleep all day and then sleep all night.  She still works during the week but on the weekends she finds herself only sleeping and laying on the cough reading.  She feels like she can fall asleep at anytime and sleep.  She does not fall asleep driving.  She has a generalized weakness along with this lack of energy.  She had been working out and she and increased her intensity with her trainer and recently she has not been able to finish her work-puts due to fatigue.  Even her trainer has noticed.  She cannot lift as much weight nor do the repetitions that she had been doing.  She is not having any SOB or CP while working out - her heart rate still rises and then quickly falls after finishing the exercise.  She feels like she is in a brain fog and things at work are taking longer than they used to not because of her lack of focus or attention but rather she feels like she cannot think straight.  She is working more because everything is taking her longer.  She has noticed that her menses are more irregular with heavy spotting the week before she is supposed to start her menses.  She is still on OCP and taking regularly.  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  She is getting almost daily cramping in her arches of her feet but none in her legs.  She about once a week has a bout of loose stool but no abd cramping or pain or change in eating or appetite.  She has had no tick bites and her endocrinologist tested her thyroid last week and it was normal.  Her depression is stable and she states that she has never  had fatigue like this with her depression in the past.  She has had no change in medications.  Patient Active Problem List   Diagnosis Date Noted  . Overactive bladder 10/06/2012  . Congenital hypothyroidism without goiter 02/18/2012  . ADD (attention deficit disorder) 02/17/2012  . Major depressive disorder 11/17/2006  . DYSPLASIA, CERVIX, MILD 11/07/2006    Current Outpatient Prescriptions on File Prior to Visit  Medication Sig Dispense Refill  . CRANBERRY PO Take by mouth daily.    . divalproex (DEPAKOTE ER) 500 MG 24 hr tablet Take 2 tablets (1,000 mg total) by mouth daily. 180 tablet 1  . escitalopram (LEXAPRO) 20 MG tablet Take 1.5 tablets (30 mg total) by mouth daily. 135 tablet 1  . fesoterodine (TOVIAZ) 8 MG TB24 tablet Take 1 tablet (8 mg total) by mouth daily. 90 tablet 3  . levothyroxine (SYNTHROID, LEVOTHROID) 88 MCG tablet Take 88 mcg by mouth daily before breakfast.    . Multiple Vitamin (MULTIVITAMIN) tablet Take 1 tablet by mouth daily.    . Norgestimate-Ethinyl Estradiol Triphasic 0.18/0.215/0.25 MG-35 MCG tablet Take 1 tablet by mouth daily. 3 Package 4   No current facility-administered medications on file prior to visit.    Allergies  Allergen Reactions  . Myrbetriq [Mirabegron] Anaphylaxis  Fatigue, HA, increased depression and did not work    Review of Systems  Constitutional: Positive for fever (feverish) and fatigue. Negative for unexpected weight change.  HENT: Negative.   Eyes: Negative.   Respiratory: Negative for cough, chest tightness and shortness of breath.   Cardiovascular: Negative for chest pain and palpitations.  Gastrointestinal: Positive for diarrhea. Negative for nausea, vomiting and abdominal pain.  Endocrine: Negative.   Genitourinary: Negative.   Musculoskeletal: Negative.   Skin: Negative.   Allergic/Immunologic: Negative.   Neurological: Negative for dizziness, weakness, light-headedness, numbness and headaches.    Psychiatric/Behavioral: Positive for dysphoric mood (no worsening symptoms). Negative for decreased concentration. The patient is not nervous/anxious.    Objective:  BP 106/70 mmHg  Pulse 89  Temp(Src) 98.4 F (36.9 C) (Oral)  Resp 16  Wt 136 lb 6.4 oz (61.871 kg)  LMP 07/10/2015  Physical Exam  Constitutional: She is oriented to person, place, and time and well-developed, well-nourished, and in no distress.  HENT:  Head: Normocephalic and atraumatic.  Right Ear: Hearing, tympanic membrane, external ear and ear canal normal.  Left Ear: Hearing, tympanic membrane, external ear and ear canal normal.  Nose: Nose normal.  Mouth/Throat: Uvula is midline, oropharynx is clear and moist and mucous membranes are normal.  Eyes: Conjunctivae and EOM are normal. Pupils are equal, round, and reactive to light.  Neck: Trachea normal and normal range of motion. Neck supple. No thyroid mass and no thyromegaly present.  Cardiovascular: Normal rate, regular rhythm and normal heart sounds.   No murmur heard. Pulmonary/Chest: Effort normal and breath sounds normal. She has no wheezes.  Abdominal: Soft. Bowel sounds are normal. There is no tenderness.  Musculoskeletal: Normal range of motion.  Lymphadenopathy:    She has no cervical adenopathy.  Neurological: She is alert and oriented to person, place, and time. She has normal motor skills, normal sensation, normal strength and normal reflexes. Gait normal.  Skin: Skin is warm and dry.  Psychiatric: Mood, memory, affect and judgment normal.  Vitals reviewed.   Results for orders placed or performed in visit on 07/13/15  COMPLETE METABOLIC PANEL WITH GFR  Result Value Ref Range   Sodium 139 135 - 146 mmol/L   Potassium 4.7 3.5 - 5.3 mmol/L   Chloride 102 98 - 110 mmol/L   CO2 28 20 - 31 mmol/L   Glucose, Bld 91 65 - 99 mg/dL   BUN 13 7 - 25 mg/dL   Creat 0.85 0.50 - 1.10 mg/dL   Total Bilirubin 0.2 0.2 - 1.2 mg/dL   Alkaline Phosphatase 40  33 - 115 U/L   AST 21 10 - 35 U/L   ALT 13 6 - 29 U/L   Total Protein 6.6 6.1 - 8.1 g/dL   Albumin 3.7 3.6 - 5.1 g/dL   Calcium 9.2 8.6 - 10.2 mg/dL   GFR, Est African American >89 >=60 mL/min   GFR, Est Non African American 81 >=60 mL/min  Epstein-Barr virus VCA antibody panel  Result Value Ref Range   EBV VCA IgG  <18.0 U/mL   EBV VCA IgM  <36.0 U/mL   EBV EA IgG  <9.0 U/mL   EBV NA IgG  <18.0 U/mL  Vit D  25 hydroxy (rtn osteoporosis monitoring)  Result Value Ref Range   Vit D, 25-Hydroxy 64 30 - 100 ng/mL  Vitamin B12  Result Value Ref Range   Vitamin B-12 928 (H) 211 - 911 pg/mL  CK  Result Value Ref Range  Total CK 117 7 - 177 U/L  HIV antibody  Result Value Ref Range   HIV 1&2 Ab, 4th Generation NONREACTIVE NONREACTIVE  Hepatitis C antibody  Result Value Ref Range   HCV Ab NEGATIVE NEGATIVE  Sedimentation rate  Result Value Ref Range   Sed Rate 4 0 - 20 mm/hr  POCT CBC  Result Value Ref Range   WBC 4.9 4.6 - 10.2 K/uL   Lymph, poc 2.2 0.6 - 3.4   POC LYMPH PERCENT 45.1 10 - 50 %L   MID (cbc) 0.3 0 - 0.9   POC MID % 6.0 0 - 12 %M   POC Granulocyte 2.4 2 - 6.9   Granulocyte percent 48.9 37 - 80 %G   RBC 4.21 4.04 - 5.48 M/uL   Hemoglobin 11.9 (A) 12.2 - 16.2 g/dL   HCT, POC 38.0 37.7 - 47.9 %   MCV 90.3 80 - 97 fL   MCH, POC 28.2 27 - 31.2 pg   MCHC 31.2 (A) 31.8 - 35.4 g/dL   RDW, POC 13.1 %   Platelet Count, POC 223 142 - 424 K/uL   MPV 7.5 0 - 99.8 fL   EKG - NSR without acute change - read with Dr Brigitte Pulse Assessment and Plan :  Other fatigue - Plan: COMPLETE METABOLIC PANEL WITH GFR, Epstein-Barr virus VCA antibody panel, Vit D  25 hydroxy (rtn osteoporosis monitoring), Vitamin B12, EKG 12-Lead, POCT CBC, HIV antibody, Hepatitis C antibody, Sedimentation rate, CANCELED: CBC with Differential/Platelet, CANCELED: POCT SEDIMENTATION RATE  Weakness - Plan: CK, HIV antibody, Hepatitis C antibody, Sedimentation rate  ADD (attention deficit disorder) - Plan:  amphetamine-dextroamphetamine (ADDERALL) 20 MG tablet   Depression - stable per patient  Pt is experiencing significant fatigue.  Her thyroid is normal and she states her depression is stable.  She has a normal exam today.  We will check labs and then determine if there are any causes of her fatigue.  I think this is going to be difficult and I have to wonder whether this is her depression though I think that it is going to be hard to convince her of this due to her long standing depression history and the fact that she feels like it is not related.  We will wait for labs and she will continue to monitor for changes and contributions.  D/w Dr Brigitte Pulse.  Windell Hummingbird PA-C  [facility] 07/14/2015 11:17 AM

## 2015-07-14 LAB — EPSTEIN-BARR VIRUS VCA ANTIBODY PANEL
EBV EA IGG: 8.8 U/mL (ref <9.0)
EBV NA IgG: >600 U/mL — ABNORMAL HIGH (ref <18.0)
EBV VCA IgG: 171 U/mL — ABNORMAL HIGH (ref <18.0)
EBV VCA IgM: <10 U/mL (ref <36.0)

## 2015-07-14 LAB — VITAMIN B12: Vitamin B-12: 928 pg/mL — ABNORMAL HIGH (ref 211–911)

## 2015-07-14 LAB — HEPATITIS C ANTIBODY: HCV Ab: NEGATIVE

## 2015-07-14 LAB — VITAMIN D 25 HYDROXY (VIT D DEFICIENCY, FRACTURES): Vit D, 25-Hydroxy: 64 ng/mL (ref 30–100)

## 2015-07-14 LAB — SEDIMENTATION RATE: Sed Rate: 4 mm/hr (ref 0–20)

## 2015-07-14 LAB — HIV ANTIBODY (ROUTINE TESTING W REFLEX): HIV: NONREACTIVE

## 2015-07-20 ENCOUNTER — Telehealth: Payer: Self-pay

## 2015-07-20 DIAGNOSIS — F988 Other specified behavioral and emotional disorders with onset usually occurring in childhood and adolescence: Secondary | ICD-10-CM

## 2015-07-20 NOTE — Telephone Encounter (Signed)
Kerri Murphy, I got a PA req from pharm on pt's adderall, and I noticed a discrepancy on the Rx. It has two different sigs, and I'm assuming you wanted the 2nd one (2 tabs BID) as pt has been on. I didn't see anything in OV notes that you were going to decrease dose, but you only wrote for #60 instead of #120. Can you please clarify the correct dosage, and may need a new Rx written (pharm note has the 1 tab BID w/ #60 on it, so if this is correct, probably don't have to re-write). I wanted to get this straight so that the PA is for the correct sig.

## 2015-07-21 NOTE — Telephone Encounter (Signed)
I am unsure what I have done for the last year but she has only been getting #60 each month.  If we could please call the patient and see how she is taking it - I will fix the Rx and hopefully in the future order it correctly.

## 2015-07-21 NOTE — Telephone Encounter (Signed)
LMOM for pt to CB.  

## 2015-07-21 NOTE — Telephone Encounter (Signed)
Pt CB and verified that Judson Roch had reduced the dose to 1 tablet twice a day. Sarah, they shouldn't need a new Rx, because that is what pharm processed. FYI  PA approved through 07/20/16, case # [id number]. Notified pharm and asked them to advise pt when Rx ready.

## 2015-08-31 ENCOUNTER — Encounter: Payer: Self-pay | Admitting: Physician Assistant

## 2015-08-31 DIAGNOSIS — F988 Other specified behavioral and emotional disorders with onset usually occurring in childhood and adolescence: Secondary | ICD-10-CM

## 2015-08-31 MED ORDER — AMPHETAMINE-DEXTROAMPHETAMINE 20 MG PO TABS: 20.0000 mg | ORAL_TABLET | Freq: Two times a day (BID) | ORAL | Status: DC

## 2015-08-31 NOTE — Telephone Encounter (Signed)
Done

## 2015-09-05 ENCOUNTER — Other Ambulatory Visit: Payer: Self-pay | Admitting: Physician Assistant

## 2015-09-07 ENCOUNTER — Ambulatory Visit: Payer: BC Managed Care – PPO | Admitting: Physician Assistant

## 2015-09-07 ENCOUNTER — Telehealth: Payer: Self-pay

## 2015-09-07 DIAGNOSIS — F988 Other specified behavioral and emotional disorders with onset usually occurring in childhood and adolescence: Secondary | ICD-10-CM

## 2015-09-07 NOTE — Telephone Encounter (Signed)
Pt req. Rx Refill for Aderal

## 2015-09-08 MED ORDER — AMPHETAMINE-DEXTROAMPHETAMINE 20 MG PO TABS: 20.0000 mg | ORAL_TABLET | Freq: Two times a day (BID) | ORAL | Status: DC

## 2015-09-08 NOTE — Telephone Encounter (Signed)
Pt.notified

## 2015-09-08 NOTE — Telephone Encounter (Signed)
Done

## 2015-09-30 ENCOUNTER — Other Ambulatory Visit: Payer: Self-pay | Admitting: Physician Assistant

## 2015-10-19 ENCOUNTER — Other Ambulatory Visit: Payer: Self-pay | Admitting: Physician Assistant

## 2015-11-30 ENCOUNTER — Encounter: Payer: Self-pay | Admitting: Physician Assistant

## 2015-11-30 DIAGNOSIS — F988 Other specified behavioral and emotional disorders with onset usually occurring in childhood and adolescence: Secondary | ICD-10-CM

## 2015-12-02 MED ORDER — AMPHETAMINE-DEXTROAMPHETAMINE 20 MG PO TABS: 20.0000 mg | ORAL_TABLET | Freq: Two times a day (BID) | ORAL | Status: DC

## 2015-12-02 NOTE — Telephone Encounter (Signed)
Done.  Pt knows about Rx being ready through mychart.

## 2015-12-04 NOTE — Telephone Encounter (Signed)
Rx in drawer. 

## 2015-12-29 ENCOUNTER — Other Ambulatory Visit: Payer: Self-pay | Admitting: Family Medicine

## 2016-02-05 ENCOUNTER — Encounter: Payer: Self-pay | Admitting: Physician Assistant

## 2016-02-05 DIAGNOSIS — F988 Other specified behavioral and emotional disorders with onset usually occurring in childhood and adolescence: Secondary | ICD-10-CM

## 2016-02-06 MED ORDER — AMPHETAMINE-DEXTROAMPHETAMINE 20 MG PO TABS: 20.0000 mg | ORAL_TABLET | Freq: Two times a day (BID) | ORAL | Status: AC

## 2016-02-06 NOTE — Telephone Encounter (Signed)
Done.  Pt knows through mychart 

## 2016-04-05 ENCOUNTER — Encounter: Payer: Self-pay | Admitting: Physician Assistant

## 2016-04-05 MED ORDER — DIVALPROEX SODIUM ER 500 MG PO TB24: 1000.0000 mg | ORAL_TABLET | Freq: Every day | ORAL | Status: DC

## 2016-04-08 MED ORDER — DIVALPROEX SODIUM ER 500 MG PO TB24: 1000.0000 mg | ORAL_TABLET | Freq: Every day | ORAL | Status: DC

## 2016-04-08 NOTE — Addendum Note (Signed)
Addended by: Mancel Bale on: 04/08/2016 07:45 PM   Modules accepted: Orders

## 2016-04-09 MED ORDER — DIVALPROEX SODIUM ER 500 MG PO TB24: 1000.0000 mg | ORAL_TABLET | Freq: Every day | ORAL | Status: DC

## 2016-04-09 NOTE — Addendum Note (Signed)
Addended by: Mancel Bale on: 04/09/2016 08:15 AM   Modules accepted: Orders

## 2016-05-09 ENCOUNTER — Other Ambulatory Visit: Payer: Self-pay

## 2016-05-09 DIAGNOSIS — Z3041 Encounter for surveillance of contraceptive pills: Secondary | ICD-10-CM

## 2016-05-09 DIAGNOSIS — N3281 Overactive bladder: Secondary | ICD-10-CM

## 2016-05-09 MED ORDER — NORGESTIM-ETH ESTRAD TRIPHASIC 0.18/0.215/0.25 MG-35 MCG PO TABS
1.0000 | ORAL_TABLET | Freq: Every day | ORAL | Status: DC
Start: 2016-05-09 — End: 2017-03-04

## 2016-05-09 MED ORDER — FESOTERODINE FUMARATE ER 8 MG PO TB24: 8.0000 mg | ORAL_TABLET | Freq: Every day | ORAL | Status: DC

## 2016-05-09 NOTE — Telephone Encounter (Signed)
pt changed from [facility] to [facility].  Fax req for Tri-Sprintec & Lisbeth Ply - sent electronically

## 2016-07-05 ENCOUNTER — Other Ambulatory Visit: Payer: Self-pay | Admitting: Physician Assistant

## 2016-08-26 ENCOUNTER — Other Ambulatory Visit: Payer: Self-pay | Admitting: Physician Assistant

## 2016-11-08 ENCOUNTER — Other Ambulatory Visit: Payer: Self-pay | Admitting: Physician Assistant

## 2016-11-08 NOTE — Telephone Encounter (Signed)
Last ov 06/2015 needs ov

## 2017-01-16 DIAGNOSIS — E039 Hypothyroidism, unspecified: Secondary | ICD-10-CM | POA: Insufficient documentation

## 2017-01-19 ENCOUNTER — Other Ambulatory Visit: Payer: Self-pay | Admitting: Physician Assistant

## 2017-01-20 NOTE — Telephone Encounter (Signed)
Last seen 06/2015, spoke to patient, has 2 months of rx left, will come see [place] before refill due

## 2017-02-14 ENCOUNTER — Other Ambulatory Visit: Payer: Self-pay | Admitting: Physician Assistant

## 2017-02-14 DIAGNOSIS — N3281 Overactive bladder: Secondary | ICD-10-CM

## 2017-03-04 ENCOUNTER — Telehealth: Payer: Self-pay

## 2017-03-04 ENCOUNTER — Encounter: Payer: Self-pay | Admitting: Physician Assistant

## 2017-03-04 ENCOUNTER — Ambulatory Visit (INDEPENDENT_AMBULATORY_CARE_PROVIDER_SITE_OTHER): Payer: BC Managed Care – PPO | Admitting: Physician Assistant

## 2017-03-04 VITALS — BP 100/80 | HR 87 | Temp 98.5°F | Resp 16 | Ht 67.0 in | Wt 144.6 lb

## 2017-03-04 DIAGNOSIS — Z3041 Encounter for surveillance of contraceptive pills: Secondary | ICD-10-CM | POA: Diagnosis not present

## 2017-03-04 DIAGNOSIS — Z1211 Encounter for screening for malignant neoplasm of colon: Secondary | ICD-10-CM

## 2017-03-04 DIAGNOSIS — N3281 Overactive bladder: Secondary | ICD-10-CM | POA: Diagnosis not present

## 2017-03-04 DIAGNOSIS — Z0000 Encounter for general adult medical examination without abnormal findings: Secondary | ICD-10-CM

## 2017-03-04 DIAGNOSIS — E031 Congenital hypothyroidism without goiter: Secondary | ICD-10-CM

## 2017-03-04 DIAGNOSIS — F332 Major depressive disorder, recurrent severe without psychotic features: Secondary | ICD-10-CM

## 2017-03-04 DIAGNOSIS — Z1231 Encounter for screening mammogram for malignant neoplasm of breast: Secondary | ICD-10-CM

## 2017-03-04 DIAGNOSIS — Z130 Encounter for screening for diseases of the blood and blood-forming organs and certain disorders involving the immune mechanism: Secondary | ICD-10-CM

## 2017-03-04 DIAGNOSIS — Z13220 Encounter for screening for lipoid disorders: Secondary | ICD-10-CM

## 2017-03-04 MED ORDER — FESOTERODINE FUMARATE ER 8 MG PO TB24: 8.0000 mg | ORAL_TABLET | Freq: Every day | ORAL | 3 refills | Status: DC

## 2017-03-04 MED ORDER — DIVALPROEX SODIUM ER 500 MG PO TB24: ORAL_TABLET | ORAL | 4 refills | Status: DC

## 2017-03-04 MED ORDER — NORGESTIM-ETH ESTRAD TRIPHASIC 0.18/0.215/0.25 MG-35 MCG PO TABS: 1.0000 | ORAL_TABLET | Freq: Every day | ORAL | 4 refills | Status: DC

## 2017-03-04 NOTE — Telephone Encounter (Signed)
Cologuard ordered faxed to [phone]

## 2017-03-04 NOTE — Patient Instructions (Addendum)
We recommend that you schedule a mammogram for breast cancer screening. Typically, you do not need a referral to do this. Please contact a local imaging center to schedule your mammogram.  [facility] - [phone]  *ask for the Radiology Department [facility]) - [phone] or [phone]  [facility] - [phone] [facility] - [phone] [facility] - [phone]  *ask for the Radiology Department [facility] - [phone]  *ask for the Radiology Department [facility] - [phone]  *ask for the Mammography Department [facility] - [phone]     IF you received an x-ray today, you will receive an invoice from [facility]. Please contact [facility] at [phone] with questions or concerns regarding your invoice.   IF you received labwork today, you will receive an invoice from [facility]. Please contact [facility] at [phone] with questions or concerns regarding your invoice.   Our billing staff will not be able to assist you with questions regarding bills from these companies.  You will be contacted with the lab results as soon as they are available. The fastest way to get your results is to activate your My Chart account. Instructions are located on the last page of this paperwork. If you have not heard from us regarding the results in 2 weeks, please contact this office.      

## 2017-03-04 NOTE — Progress Notes (Signed)
Kerri Murphy  MRN: [id number] DOB: 15-Aug-1966  PCP: Mancel Bale, PA-C  Subjective:  Pt presents to clinic for a CPE.  She is doing well.  Last year she change her pysch treatment to Dr Toy Cookey and he has been writing her depression and ADD meds.  She is doing well with her depression currently.  She did stop working out with her depression but plans to get back into it.  Last dental exam: every 6 months Last vision exam: yearly Last pap: 2015 - normal with neg HPV Last mammo: never Last colonoscopy: never Vaccinations      Tetanus  Typical meals for patient: 2.5 meals, snacks - healthy choice Typical beverage choices: water Exercises: not recently but plans to get back into it over the summer  Sleeps: sleeping well 9 hrs per night  Patient Active Problem List   Diagnosis Date Noted  . Overactive bladder 10/06/2012  . Congenital hypothyroidism without goiter 02/18/2012  . ADD (attention deficit disorder) 02/17/2012  . Major depressive disorder 11/17/2006  . DYSPLASIA, CERVIX, MILD 11/07/2006    Review of Systems  Constitutional: Negative.   HENT: Negative.   Eyes: Negative.   Respiratory: Negative.   Cardiovascular: Negative.   Gastrointestinal: Negative.   Endocrine: Negative.   Genitourinary: Negative.   Musculoskeletal: Negative.   Skin: Positive for rash (bruising on her arms).  Allergic/Immunologic: Negative.   Neurological: Negative.   Hematological: Negative.   Psychiatric/Behavioral: Negative.      Current Outpatient Prescriptions on File Prior to Visit  Medication Sig Dispense Refill  . amphetamine-dextroamphetamine (ADDERALL) 20 MG tablet Take 1 tablet (20 mg total) by mouth 2 (two) times daily. Two tablets twice a day 60 tablet 0  . levothyroxine (SYNTHROID, LEVOTHROID) 88 MCG tablet Take 88 mcg by mouth daily before breakfast.    . Multiple Vitamin (MULTIVITAMIN) tablet Take 1 tablet by mouth daily.    Marland Kitchen CRANBERRY PO Take by mouth daily.     No  current facility-administered medications on file prior to visit.     Allergies  Allergen Reactions  . Myrbetriq [Mirabegron] Anaphylaxis    Fatigue, HA, increased depression and did not work    Social History   Social History  . Marital status: Divorced    Spouse name: N/A  . Number of children: N/A  . Years of education: N/A   Occupational History  . professor [employer]   Social History Main Topics  . Smoking status: Never Smoker  . Smokeless tobacco: Never Used  . Alcohol use Yes     Comment: social - 1-2 drinks per month  . Drug use: No  . Sexual activity: Yes    Partners: Male   Other Topics Concern  . 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 APPENDECTOMY      Family History  Problem Relation Age of Onset  . Heart disease Father   . Hyperlipidemia Mother   . Mental retardation Mother   . Heart disease Maternal Grandmother   . Cancer Maternal Grandfather        pancreatice  . Alzheimer's disease Paternal Grandmother   . Cancer Paternal Grandfather        colon  Objective:  BP 100/80 (BP Location: Left Arm, Patient Position: Sitting, Cuff Size: Normal)   Pulse 87   Temp 98.5 F (36.9 C) (Oral)   Resp 16   Ht _0  (1.702 m)   Wt 144 lb 9.6 oz (65.6 kg)   LMP 02/17/2017   SpO2 95%   BMI 22.65 kg/m   Physical Exam  Constitutional: She is oriented to person, place, and time and well-developed, well-nourished, and in no distress.  HENT:  Head: Normocephalic and atraumatic.  Right Ear: Hearing, tympanic membrane, external ear and ear canal normal.  Left Ear: Hearing, tympanic membrane, external ear and ear canal normal.  Nose: Nose normal.  Mouth/Throat: Uvula is midline, oropharynx is clear and moist and mucous membranes are normal.  Eyes:  Conjunctivae and EOM are normal. Pupils are equal, round, and reactive to light.  Neck: Trachea normal and normal range of motion. Neck supple. No thyroid mass and no thyromegaly present.  Cardiovascular: Normal rate, regular rhythm and normal heart sounds.   No murmur heard. Pulmonary/Chest: Effort normal and breath sounds normal. She has no wheezes. Right breast exhibits no inverted nipple, no mass, no nipple discharge, no skin change and no tenderness. Left breast exhibits no inverted nipple, no mass, no nipple discharge, no skin change and no tenderness. Breasts are symmetrical.  Abdominal: Soft. Bowel sounds are normal. There is no tenderness.  Musculoskeletal: Normal range of motion.  Lymphadenopathy:    She has no cervical adenopathy.  Neurological: She is alert and oriented to person, place, and time. She has normal motor skills, normal sensation, normal strength and normal reflexes. Gait normal.  Skin: Skin is warm and dry.  Bruising on forearms -- small - more like petechie   Psychiatric: Mood, memory, affect and judgment normal.    Wt Readings from Last 3 Encounters:  03/04/17 144 lb 9.6 oz (65.6 kg)  07/13/15 136 lb 6.4 oz (61.9 kg)  03/08/15 137 lb 3.2 oz (62.2 kg)     Visual Acuity Screening   Right eye Left eye Both eyes  Without correction:     With correction: _1    Assessment and Plan :  Annual physical exam  Overactive bladder - Plan: fesoterodine (TOVIAZ) 8 MG TB24 tablet - continue medications  Encounter for surveillance of contraceptive pills - Plan: Norgestimate-Ethinyl Estradiol Triphasic 0.18/0.215/0.25 MG-35 MCG tablet - continue current medication - she is having no perimenopausal symptoms  Congenital hypothyroidism without goiter - f/u with [place]  Screening for deficiency anemia - Plan: CBC with Differential/Platelet  Severe episode of recurrent major depressive disorder, without psychotic features ([place]) - Plan: CMP14+EGFR, Valproic  acid level - continue f/u with Dr Toy Cookey  Screening cholesterol level - Plan: Lipid panel, divalproex (DEPAKOTE ER) 500 MG 24 hr tablet  Screen for colon cancer - Plan: Cologuard - pt is willing to do this screening - she is low risk  Screening mammogram, encounter for - Plan: MM Digital Screening - this will be the patient 1st screening [place] PA-C  [facility] [place] 03/04/2017 4:47 PM

## 2017-03-05 LAB — CBC WITH DIFFERENTIAL/PLATELET
Basophils Absolute: 0 10*3/uL (ref 0.0–0.2)
Basos: 0 %
EOS (ABSOLUTE): 0 10*3/uL (ref 0.0–0.4)
Eos: 1 %
Hematocrit: 33.4 % — ABNORMAL LOW (ref 34.0–46.6)
Hemoglobin: 11 g/dL — ABNORMAL LOW (ref 11.1–15.9)
IMMATURE GRANS (ABS): 0 10*3/uL (ref 0.0–0.1)
IMMATURE GRANULOCYTES: 0 %
LYMPHS ABS: 2.1 10*3/uL (ref 0.7–3.1)
LYMPHS: 42 %
MCH: 29 pg (ref 26.6–33.0)
MCHC: 32.9 g/dL (ref 31.5–35.7)
MCV: 88 fL (ref 79–97)
MONOCYTES: 6 %
Monocytes Absolute: 0.3 10*3/uL (ref 0.1–0.9)
Neutrophils Absolute: 2.5 10*3/uL (ref 1.4–7.0)
Neutrophils: 51 %
PLATELETS: 216 10*3/uL (ref 150–379)
RBC: 3.79 x10E6/uL (ref 3.77–5.28)
RDW: 13.4 % (ref 12.3–15.4)
WBC: 4.9 10*3/uL (ref 3.4–10.8)

## 2017-03-05 LAB — CMP14+EGFR
ALBUMIN: 3.7 g/dL (ref 3.5–5.5)
ALK PHOS: 53 IU/L (ref 39–117)
ALT: 8 IU/L (ref 0–32)
AST: 14 IU/L (ref 0–40)
Albumin/Globulin Ratio: 1.3 (ref 1.2–2.2)
BUN / CREAT RATIO: 16 (ref 9–23)
BUN: 16 mg/dL (ref 6–24)
Bilirubin Total: <0.2 mg/dL (ref 0.0–1.2)
CO2: 23 mmol/L (ref 18–29)
CREATININE: 0.99 mg/dL (ref 0.57–1.00)
Calcium: 8.8 mg/dL (ref 8.7–10.2)
Chloride: 100 mmol/L (ref 96–106)
GFR calc Af Amer: 77 mL/min/{1.73_m2} (ref >59)
GFR calc non Af Amer: 67 mL/min/{1.73_m2} (ref >59)
Globulin, Total: 2.9 g/dL (ref 1.5–4.5)
Glucose: 91 mg/dL (ref 65–99)
Potassium: 4.2 mmol/L (ref 3.5–5.2)
Sodium: 137 mmol/L (ref 134–144)
Total Protein: 6.6 g/dL (ref 6.0–8.5)

## 2017-03-05 LAB — LIPID PANEL
CHOLESTEROL TOTAL: 221 mg/dL — AB (ref 100–199)
Chol/HDL Ratio: 2.6 ratio (ref 0.0–4.4)
HDL: 85 mg/dL (ref >39)
LDL CALC: 116 mg/dL — AB (ref 0–99)
TRIGLYCERIDES: 99 mg/dL (ref 0–149)
VLDL Cholesterol Cal: 20 mg/dL (ref 5–40)

## 2017-03-05 LAB — VALPROIC ACID LEVEL: Valproic Acid Lvl: 56 ug/mL (ref 50–100)

## 2017-03-10 ENCOUNTER — Encounter: Payer: Self-pay | Admitting: Physician Assistant

## 2017-03-20 ENCOUNTER — Ambulatory Visit (INDEPENDENT_AMBULATORY_CARE_PROVIDER_SITE_OTHER): Payer: BC Managed Care – PPO | Admitting: Physician Assistant

## 2017-03-20 ENCOUNTER — Encounter: Payer: Self-pay | Admitting: Physician Assistant

## 2017-03-20 VITALS — BP 103/63 | HR 80 | Temp 98.0°F | Resp 16 | Ht 67.0 in | Wt 142.4 lb

## 2017-03-20 DIAGNOSIS — N926 Irregular menstruation, unspecified: Secondary | ICD-10-CM

## 2017-03-20 MED ORDER — NORETHINDRONE-MESTRANOL 1-50 MG-MCG PO TABS: 1.0000 | ORAL_TABLET | Freq: Every day | ORAL | 0 refills | Status: DC

## 2017-03-20 NOTE — Patient Instructions (Signed)
     IF you received an x-ray today, you will receive an invoice from [facility]. Please contact [facility] at [phone] with questions or concerns regarding your invoice.   IF you received labwork today, you will receive an invoice from [facility]. Please contact [facility] at [phone] with questions or concerns regarding your invoice.   Our billing staff will not be able to assist you with questions regarding bills from these companies.  You will be contacted with the lab results as soon as they are available. The fastest way to get your results is to activate your My Chart account. Instructions are located on the last page of this paperwork. If you have not heard from us regarding the results in 2 weeks, please contact this office.     

## 2017-03-20 NOTE — Progress Notes (Signed)
Kerri Murphy  MRN: [id number] DOB: 04-19-1966  PCP: Mancel Bale, PA-C  Chief Complaint  Patient presents with  . Vaginal Bleeding    Spotting between periods - start period Monday but spotted 2 weeks before it began     Subjective:  Pt presents to clinic for irregular menses that she has had for the last 4-5 months - she tried to give it some time but it is not is better.  She has had spotting about 10 days into her pack for typically 7-10 days and then she has a few days with nothing and then her menses which is the same flow and length as always.  She has been on this pill for years.             Review of Systems  Genitourinary: Positive for menstrual problem.    Patient Active Problem List   Diagnosis Date Noted  . Overactive bladder 10/06/2012  . Congenital hypothyroidism without goiter 02/18/2012  . ADD (attention deficit disorder) 02/17/2012  . Major depressive disorder 11/17/2006  . DYSPLASIA, CERVIX, MILD 11/07/2006    Current Outpatient Prescriptions on File Prior to Visit  Medication Sig Dispense Refill  . amphetamine-dextroamphetamine (ADDERALL) 20 MG tablet Take 1 tablet (20 mg total) by mouth 2 (two) times daily. Two tablets twice a day 60 tablet 0  . buPROPion (WELLBUTRIN XL) 150 MG 24 hr tablet Take 150 mg by mouth daily.    Marland Kitchen CRANBERRY PO Take by mouth daily.    . divalproex (DEPAKOTE ER) 500 MG 24 hr tablet TAKE 2 TABLETS DAILY 180 tablet 4  . fesoterodine (TOVIAZ) 8 MG TB24 tablet Take 1 tablet (8 mg total) by mouth daily. 90 tablet 3  . levothyroxine (SYNTHROID, LEVOTHROID) 88 MCG tablet Take 88 mcg by mouth daily before breakfast.    . Multiple Vitamin (MULTIVITAMIN) tablet Take 1 tablet by mouth daily.    . ARIPiprazole (ABILIFY) 5 MG tablet Take by mouth daily.     No current facility-administered medications on file prior to visit.     Allergies  Allergen Reactions  . Myrbetriq [Mirabegron] Anaphylaxis    Fatigue, HA, increased depression  and did not work    Pt patients past, family and social history were reviewed and updated.   Objective:  BP 103/63   Pulse 80   Temp 98 F (36.7 C) (Oral)   Resp 16   Ht 5\' 7"  (1.702 m)   Wt 142 lb 6.4 oz (64.6 kg)   LMP 03/17/2016   SpO2 99%   BMI 22.30 kg/m   Physical Exam  Constitutional: She is oriented to person, place, and time and well-developed, well-nourished, and in no distress.  HENT:  Head: Normocephalic and atraumatic.  Right Ear: Hearing and external ear normal.  Left Ear: Hearing and external ear normal.  Eyes: Conjunctivae are normal.  Neck: Normal range of motion.  Pulmonary/Chest: Effort normal.  Neurological: She is alert and oriented to person, place, and time. Gait normal.  Skin: Skin is warm and dry.  Psychiatric: Mood, memory, affect and judgment normal.  Vitals reviewed.   Assessment and Plan :  Irregular menses - Plan: Norethindrone-Mestranol (NECON 1/50, 28,) 1-50 MG-MCG tablet Pt is having irregular menses during the 1st half of her pills - we will increase her estrogen level and this should take care of it.  A 3 month supply was given to patient and she will let me know if her problems resolve or if she has  any side effects.  Windell Hummingbird PA-C  [facility] 03/20/2017 2:40 PM

## 2017-03-22 ENCOUNTER — Encounter: Payer: Self-pay | Admitting: Physician Assistant

## 2017-03-31 ENCOUNTER — Encounter: Payer: Self-pay | Admitting: Physician Assistant

## 2017-04-03 ENCOUNTER — Other Ambulatory Visit: Payer: Self-pay | Admitting: Physician Assistant

## 2017-04-03 MED ORDER — NORGESTREL-ETHINYL ESTRADIOL 0.5-50 MG-MCG PO TABS: 1.0000 | ORAL_TABLET | Freq: Every day | ORAL | 4 refills | Status: DC

## 2017-04-04 LAB — COLOGUARD: Cologuard: NEGATIVE

## 2017-06-27 ENCOUNTER — Encounter: Payer: Self-pay | Admitting: Physician Assistant

## 2017-06-27 DIAGNOSIS — N926 Irregular menstruation, unspecified: Secondary | ICD-10-CM

## 2017-06-27 DIAGNOSIS — N941 Unspecified dyspareunia: Secondary | ICD-10-CM

## 2017-07-01 MED ORDER — NORGESTREL-ETHINYL ESTRADIOL 0.5-50 MG-MCG PO TABS: 1.0000 | ORAL_TABLET | Freq: Every day | ORAL | 4 refills | Status: DC

## 2017-07-01 NOTE — Telephone Encounter (Signed)
Done

## 2017-07-03 ENCOUNTER — Other Ambulatory Visit (HOSPITAL_COMMUNITY)
Admission: RE | Admit: 2017-07-03 | Discharge: 2017-07-03 | Disposition: A | Discharge status: Home / Self Care | Payer: BC Managed Care – PPO | Source: Ambulatory Visit | Attending: Obstetrics and Gynecology | Admitting: Obstetrics and Gynecology

## 2017-07-03 ENCOUNTER — Telehealth: Payer: Self-pay | Admitting: Obstetrics and Gynecology

## 2017-07-03 ENCOUNTER — Ambulatory Visit (INDEPENDENT_AMBULATORY_CARE_PROVIDER_SITE_OTHER): Payer: BC Managed Care – PPO | Admitting: Obstetrics and Gynecology

## 2017-07-03 ENCOUNTER — Encounter: Payer: Self-pay | Admitting: Obstetrics and Gynecology

## 2017-07-03 VITALS — BP 120/60 | HR 88 | Ht 68.0 in | Wt 138.0 lb

## 2017-07-03 DIAGNOSIS — N951 Menopausal and female climacteric states: Secondary | ICD-10-CM

## 2017-07-03 DIAGNOSIS — N941 Unspecified dyspareunia: Secondary | ICD-10-CM | POA: Diagnosis not present

## 2017-07-03 DIAGNOSIS — R102 Pelvic and perineal pain: Secondary | ICD-10-CM | POA: Diagnosis not present

## 2017-07-03 DIAGNOSIS — Z124 Encounter for screening for malignant neoplasm of cervix: Secondary | ICD-10-CM | POA: Insufficient documentation

## 2017-07-03 DIAGNOSIS — N760 Acute vaginitis: Secondary | ICD-10-CM | POA: Insufficient documentation

## 2017-07-03 DIAGNOSIS — Z3009 Encounter for other general counseling and advice on contraception: Secondary | ICD-10-CM

## 2017-07-03 DIAGNOSIS — R109 Unspecified abdominal pain: Secondary | ICD-10-CM

## 2017-07-03 DIAGNOSIS — R87810 Cervical high risk human papillomavirus (HPV) DNA test positive: Secondary | ICD-10-CM | POA: Diagnosis not present

## 2017-07-03 DIAGNOSIS — Z862 Personal history of diseases of the blood and blood-forming organs and certain disorders involving the immune mechanism: Secondary | ICD-10-CM

## 2017-07-03 DIAGNOSIS — N946 Dysmenorrhea, unspecified: Secondary | ICD-10-CM | POA: Diagnosis not present

## 2017-07-03 DIAGNOSIS — Z01419 Encounter for gynecological examination (general) (routine) without abnormal findings: Secondary | ICD-10-CM

## 2017-07-03 MED ORDER — NORETHIN ACE-ETH ESTRAD-FE 1-20 MG-MCG PO TABS: 1.0000 | ORAL_TABLET | Freq: Every day | ORAL | 0 refills | Status: DC

## 2017-07-03 NOTE — Progress Notes (Signed)
51 y.o. G1P1001 DivorcedCaucasianF here for annual exam. Patient is c/o RLQ pain, dysmenorrhea and dyspareunia. Period Cycle (Days): 28 Period Duration (Days): 7 days  Period Pattern: Regular Menstrual Flow: Heavy, Light Menstrual Control: Maxi pad, Tampon Menstrual Control Change Freq (Hours): changes tampon every 3-4 hours on heavy days  Dysmenorrhea: (!) Severe Dysmenorrhea Symptoms: Cramping  Cramps have been getting worse over the last year. She had one episode of severe pain in the RLQ with intercourse about a month ago, had to stop having sex and the pain improved. She has an "awareness" of her RLQ intermittently for the last few months. Normal BM every day, no changes.  She is on OCP's. 3 months ago her primary changed her OCPs because she was spotting from midcycle to her meses (for 6 months).   Patient's last menstrual period was 06/17/2017.          Sexually active: Yes.    The current method of family planning is OCP (estrogen/progesterone).    Exercising: Yes.    cardio/ strength training  Smoker:  no  Health Maintenance: Pap:  2015 WNL per patient  History of abnormal Pap:  Yes- 15-20 years ago, f/u was normal.  MMG:  Never Colonoscopy:  Never, just did the cologaurd which was negative in June Cologuard: 04-03-17 Normal per patient  BMD:   Never TDaP:  2011 Gardasil: N/A   reports that she has never smoked. She has never used smokeless tobacco. She reports that she drinks alcohol. She reports that she does not use drugs. She rarely drinks ETOH. She teaches at [employer], recreation and parks management  Past Medical History:  Diagnosis Date  . Anemia   . Depression   . Fibroid   . Mood disorder ([place])   . Thyroid disease     Past Surgical History:  Procedure Laterality Date  . APPENDECTOMY      Current Outpatient Prescriptions  Medication Sig Dispense Refill  . amphetamine-dextroamphetamine (ADDERALL) 20 MG tablet Take 1 tablet (20 mg total) by mouth 2 (two) times  daily. Two tablets twice a day 60 tablet 0  . buPROPion (WELLBUTRIN XL) 150 MG 24 hr tablet Take 150 mg by mouth daily.    . divalproex (DEPAKOTE ER) 500 MG 24 hr tablet TAKE 2 TABLETS DAILY 180 tablet 4  . fesoterodine (TOVIAZ) 8 MG TB24 tablet Take 1 tablet (8 mg total) by mouth daily. 90 tablet 3  . levothyroxine (SYNTHROID, LEVOTHROID) 88 MCG tablet Take 88 mcg by mouth daily before breakfast.    . norgestrel-ethinyl estradiol (OGESTREL) 0.5-50 MG-MCG tablet Take 1 tablet by mouth daily. 3 Package 4   No current facility-administered medications for this visit.     Family History  Problem Relation Age of Onset  . Heart disease Father   . Hyperlipidemia Mother   . Mental retardation Mother   . Heart disease Maternal Grandmother   . Cancer Maternal Grandfather        pancreatice  . Alzheimer's disease Paternal Grandmother   . Cancer Paternal Grandfather        colon    Review of Systems  Constitutional: Negative.   HENT: Negative.   Eyes: Negative.   Respiratory: Negative.   Cardiovascular: Negative.   Gastrointestinal: Negative.   Endocrine: Negative.   Genitourinary: Positive for dyspareunia, menstrual problem and pelvic pain.       RLQ pain Dysmenorrhea   Musculoskeletal: Negative.   Skin: Negative.   Allergic/Immunologic: Negative.   Neurological: Negative.  Psychiatric/Behavioral: Negative.     Exam:   BP 120/60 (BP Location: Right Arm, Patient Position: Sitting, Cuff Size: Normal)   Pulse 88   Ht 5\' 8"  (1.727 m)   Wt 138 lb (62.6 kg)   LMP 06/17/2017   BMI 20.98 kg/m   Weight change: @WEIGHTCHANGE @ Height:   Height: 5\' 8"  (172.7 cm)  Ht Readings from Last 3 Encounters:  07/03/17 5\' 8"  (1.727 m)  03/20/17 5\' 7"  (1.702 m)  03/04/17 5\' 7"  (1.702 m)    General appearance: alert, cooperative and appears stated age Head: Normocephalic, without obvious abnormality, atraumatic Neck: no adenopathy, supple, symmetrical, trachea midline and thyroid normal to  inspection and palpation Lungs: clear to auscultation bilaterally Cardiovascular: regular rate and rhythm Breasts: normal appearance, no masses or tenderness Abdomen: soft, non-tender; bowel sounds normal; no masses,  no organomegaly Extremities: extremities normal, atraumatic, no cyanosis or edema Skin: Skin color, texture, turgor normal. No rashes or lesions Lymph nodes: Cervical, supraclavicular, and axillary nodes normal. No abnormal inguinal nodes palpated Neurologic: Grossly normal   Pelvic: External genitalia:  no lesions              Urethra:  normal appearing urethra with no masses, tenderness or lesions              Bartholins and Skenes: normal                 Vagina: normal appearing vagina with normal color and discharge, no lesions              Cervix: no cervical motion tenderness and no lesions               Bimanual Exam:  Uterus:  normal size, contour, position, consistency, mobility, non-tender and retroverted              Adnexa: no mass, fullness, tenderness               Rectovaginal: Confirms               Anus:  normal sphincter tone, no lesions Pelvic floor: minimal discomfort on the left pelvic floor only  Chaperone was present for exam.  A:  Well Woman with normal exam  RLQ abdominal discomfort  Deep dyspareunia in the RLQ  On high dose OCP's, recommended she go off of these secondary to the increased risks of clotting  Anemia  Dysmenorrhea    P:   Stop high dose OCP's, she is about to start the placebo pills  Return at the end of next week for [facility], estradiol, AMH, CBC, Ferritin  Will give her one pack of loestrin 1/20, she may have BTB (depakote may effect the OCP)  She needs to get a mammogram, she will schedule  F/u for an ultrasound  Discussed breast self exam  Discussed calcium and vit D intake

## 2017-07-03 NOTE — Patient Instructions (Signed)

## 2017-07-03 NOTE — Telephone Encounter (Signed)
Call placed to patient to review benefits and scheduled recommended ultrasound. Patient was unable to talk due to driving and states she will call back to schedule

## 2017-07-06 LAB — CYTOLOGY - PAP
Diagnosis: NEGATIVE
HPV 16/18/45 genotyping: NEGATIVE
HPV: DETECTED — AB

## 2017-07-08 ENCOUNTER — Ambulatory Visit (INDEPENDENT_AMBULATORY_CARE_PROVIDER_SITE_OTHER): Payer: BC Managed Care – PPO | Admitting: Obstetrics and Gynecology

## 2017-07-08 ENCOUNTER — Encounter: Payer: Self-pay | Admitting: Obstetrics and Gynecology

## 2017-07-08 ENCOUNTER — Ambulatory Visit (INDEPENDENT_AMBULATORY_CARE_PROVIDER_SITE_OTHER): Payer: BC Managed Care – PPO

## 2017-07-08 VITALS — BP 124/60 | HR 80 | Resp 15 | Wt 139.0 lb

## 2017-07-08 DIAGNOSIS — R109 Unspecified abdominal pain: Secondary | ICD-10-CM

## 2017-07-08 DIAGNOSIS — N946 Dysmenorrhea, unspecified: Secondary | ICD-10-CM | POA: Diagnosis not present

## 2017-07-08 DIAGNOSIS — N941 Unspecified dyspareunia: Secondary | ICD-10-CM | POA: Diagnosis not present

## 2017-07-08 DIAGNOSIS — R102 Pelvic and perineal pain: Secondary | ICD-10-CM

## 2017-07-08 NOTE — Progress Notes (Signed)
GYNECOLOGY  VISIT   HPI: 51 y.o.   Divorced  Caucasian  female   G1P1001 with Patient's last menstrual period was 07/08/2017.   here for follow up RLQ pain, dysmenorrhea and dyspareunia.   The patient has been on high dose OCP's. She is returning at the end of this week (on placebo's) to check hormone levels. She has been given a one month script of loestrin 1/20   GYNECOLOGIC HISTORY: Patient's last menstrual period was 07/08/2017. Contraception:OCP Menopausal hormone therapy: none         OB History    Gravida Para Term Preterm AB Living   1 1 1     1    SAB TAB Ectopic Multiple Live Births           1         Patient Active Problem List   Diagnosis Date Noted  . Overactive bladder 10/06/2012  . Congenital hypothyroidism without goiter 02/18/2012  . ADD (attention deficit disorder) 02/17/2012  . Major depressive disorder 11/17/2006  . DYSPLASIA, CERVIX, MILD 11/07/2006    Past Medical History:  Diagnosis Date  . Anemia   . Depression   . Fibroid   . Mood disorder ([place])   . Thyroid disease     Past Surgical History:  Procedure Laterality Date  . APPENDECTOMY      Current Outpatient Prescriptions  Medication Sig Dispense Refill  . amphetamine-dextroamphetamine (ADDERALL) 20 MG tablet Take 1 tablet (20 mg total) by mouth 2 (two) times daily. Two tablets twice a day 60 tablet 0  . buPROPion (WELLBUTRIN XL) 150 MG 24 hr tablet Take 150 mg by mouth daily.    . divalproex (DEPAKOTE ER) 500 MG 24 hr tablet TAKE 2 TABLETS DAILY 180 tablet 4  . fesoterodine (TOVIAZ) 8 MG TB24 tablet Take 1 tablet (8 mg total) by mouth daily. 90 tablet 3  . levothyroxine (SYNTHROID, LEVOTHROID) 88 MCG tablet Take 88 mcg by mouth daily before breakfast.    . norethindrone-ethinyl estradiol (JUNEL FE,GILDESS FE,LOESTRIN FE) 1-20 MG-MCG tablet Take 1 tablet by mouth daily. 1 Package 0   No current facility-administered medications for this visit.      ALLERGIES: Myrbetriq  [mirabegron]  Family History  Problem Relation Age of Onset  . Heart disease Father   . Hyperlipidemia Mother   . Mental retardation Mother   . Heart disease Maternal Grandmother   . Cancer Maternal Grandfather        pancreatice  . Alzheimer's disease Paternal Grandmother   . Cancer Paternal Grandfather        colon    Social History   Social History  . Marital status: Divorced    Spouse name: N/A  . Number of children: N/A  . Years of education: N/A   Occupational History  . professor [employer]   Social History Main Topics  . Smoking status: Never Smoker  . Smokeless tobacco: Never Used  . Alcohol use Yes     Comment: social - 1-2 drinks per month  . Drug use: No  . Sexual activity: Yes    Partners: Male    Birth control/ protection: Pill   Other Topics Concern  . 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   HENT: Negative.   Eyes: Negative.   Respiratory: Negative.   Cardiovascular: Negative.   Gastrointestinal: Negative.   Genitourinary: Negative.   Musculoskeletal: Negative.   Skin: Negative.   Neurological: Negative.   Endo/Heme/Allergies: Negative.   Psychiatric/Behavioral: Negative.     PHYSICAL EXAMINATION:    BP 124/60 (BP Location: Right Arm, Patient Position: Sitting, Cuff Size: Normal)   Pulse 80   Resp 15   Wt 139 lb (63 kg)   LMP 07/08/2017   BMI 21.13 kg/m     General appearance: alert, cooperative and appears stated age  Reviewed ultrasound images with the patient. Few small myomas, otherwise normal ultrasound with a thin endometrial stripe  ASSESSMENT H/O RLQ pain  Severe dysmenorrhea Dyspareunia, severe episode in the RLQ x 1 Contraception    PLAN Normal ultrasound (other than small myomas) She will return at the end of this  week for [facility], estradiol and AMH Depending on hormone levels will discuss contraception going forward She can continue on low dose OCP's if tolerating, would recommend she consider a mirena IUD    An After Visit Summary was printed and given to the patient.

## 2017-07-09 ENCOUNTER — Telehealth: Payer: Self-pay | Admitting: *Deleted

## 2017-07-09 MED ORDER — METRONIDAZOLE 0.75 % VA GEL: 1.0000 | Freq: Every day | VAGINAL | 0 refills | Status: DC

## 2017-07-09 NOTE — Telephone Encounter (Signed)
Called patient with results. Patient would like Metrogel sent into pharmacy. RX sent. Patient aware that we are adding HPV typing to her PAP and will contact her with results. Faxed over add on to [facility]

## 2017-07-09 NOTE — Telephone Encounter (Signed)
-----   Message from Salvadore Dom, MD sent at 07/08/2017  6:26 PM EDT ----- Please add hpv 16/18/45 onto her pap Please inform the patient that her pap was also + for BV. IF symptomatic,  treat with flagyl (either oral or vaginal, her choice), no ETOH while on Flagyl. If she isn't symptomatic, no treatment is needed.  Oral: Flagyl 500 mg BID x 7 days, or Vaginal: Metrogel, 1 applicator per vagina q day x 5 days. Please let her know that her pap was negative, but + for hpv and that we are adding further testing. Further plans depend on the f/u testing

## 2017-07-11 ENCOUNTER — Other Ambulatory Visit: Payer: BC Managed Care – PPO

## 2017-07-11 DIAGNOSIS — Z862 Personal history of diseases of the blood and blood-forming organs and certain disorders involving the immune mechanism: Secondary | ICD-10-CM

## 2017-07-11 DIAGNOSIS — Z3009 Encounter for other general counseling and advice on contraception: Secondary | ICD-10-CM

## 2017-07-11 DIAGNOSIS — N951 Menopausal and female climacteric states: Secondary | ICD-10-CM

## 2017-07-14 LAB — CBC
HEMATOCRIT: 36 % (ref 34.0–46.6)
Hemoglobin: 11.6 g/dL (ref 11.1–15.9)
MCH: 29.1 pg (ref 26.6–33.0)
MCHC: 32.2 g/dL (ref 31.5–35.7)
MCV: 90 fL (ref 79–97)
Platelets: 285 10*3/uL (ref 150–379)
RBC: 3.99 x10E6/uL (ref 3.77–5.28)
RDW: 13.2 % (ref 12.3–15.4)
WBC: 6 10*3/uL (ref 3.4–10.8)

## 2017-07-14 LAB — ANTI MULLERIAN HORMONE: ANTI-MULLERIAN HORMONE (AMH): 0.218 ng/mL

## 2017-07-14 LAB — FOLLICLE STIMULATING HORMONE: FSH: 28.4 m[IU]/mL

## 2017-07-14 LAB — FERRITIN: FERRITIN: 11 ng/mL — AB (ref 15–150)

## 2017-07-14 LAB — ESTRADIOL: Estradiol: <5 pg/mL

## 2017-07-17 NOTE — Telephone Encounter (Signed)
Patient is calling to talk with a nurse. Patient stopped taking her birth control and is now spotting.

## 2017-07-18 NOTE — Telephone Encounter (Signed)
Spoke with patient, advised as seen below per Dr. Talbert Nan. Patient declines OV at this time, will continue to monitor. Patient thankful for f/u and verbalizes understanding.  Patient is agreeable to disposition. Will close encounter.

## 2017-07-18 NOTE — Telephone Encounter (Signed)
Spoke with patient. Patient states she was advised on 9/24 that she was peri-menopausal, stop OCP. Stopped OCP 9/25 and has been spotting since. Patient asking if this is normal? Denies any other GYN symptoms.  Advised patient bleeding is likely after stopping OCP, reviewed recommendations per Dr. Talbert Nan -see lab result note dated 07/14/17. Patient asking when she may restart OCP? Advised patient this will depend on bleeding and cycles. Continue to monitor and calendar bleeding, return call to office if bleeding becomes heavier than normal cycle or if no cycle in 2 months.   Advised patient would review with Dr. Nelson Chimes and return call with any additional recommendations, patient verbalizes understanding and is agreeable.   Dr. Nelson Chimes, any additional recommendations?

## 2017-07-18 NOTE — Telephone Encounter (Signed)
I wouldn't anticipate that she would bleed for more than a week after stopping OCP's. It is normal to have some bleeding when coming off of OCP's. I'm hoping to figure out if she will still cycle on her own. I would recommend she calendar any bleeding and f/u in 2 months. She is almost 37, if she doesn't need to be on OCP's, it is preferable to be off of them. If she would like to come in and discuss this further, please set up an appointment.

## 2017-08-05 ENCOUNTER — Telehealth: Payer: Self-pay

## 2017-08-12 NOTE — Telephone Encounter (Signed)
error 

## 2017-09-05 ENCOUNTER — Ambulatory Visit: Payer: BC Managed Care – PPO | Admitting: Physician Assistant

## 2017-09-16 ENCOUNTER — Encounter: Payer: Self-pay | Admitting: Obstetrics and Gynecology

## 2017-09-19 ENCOUNTER — Telehealth: Payer: Self-pay

## 2017-09-19 NOTE — Telephone Encounter (Signed)
Telephone encounter routed to [place] for review as Dr.Jertson is out of the office.

## 2017-09-19 NOTE — Telephone Encounter (Signed)
Non-Urgent Medical Question  Message [id number]  From Leigha, [place] To Salvadore Dom, MD Sent 09/16/2017 9:15 AM  As directed, I stopped my birth control pills on Sept 25 and was told to track my cycle for the next couple of months. I immediately had spotting when I stopped. I had a day or so of light spotting late October/early November. I again began spotting (more than before, but still not heavily) last week and it lasted about 4 days. Should I remain off the birth control and what are my chances of pregnancy? Thank you. Kim   Responsible Party   Pool - [facility] Pool No one has taken responsibility for this message.  No actions have been taken on this message.   Notes recorded by Salvadore Dom, MD on 07/14/2017 at 2:23 PM EDT Please let the patient know that her blood work did come back in the menopausal range. One of the tests (the [facility]) was just in the menopausal range which makes me suspect she is peri-menopausal.  I would recommend she do a trial off of OCP's, calendar any bleeding and call if she doesn't have a cycle in 2 months. I think pregnancy is exteremly unlikely. She can use condoms for the first few months while we figure out if she is still cycling at all. If perimenopausal, her hormones can fluctuate up and down and she may cycle.  She wasn't anemic, but had low iron stores. I would recommend that she increase the iron in her diet.

## 2017-09-19 NOTE — Telephone Encounter (Signed)
I would recommend a BUM and will route this to Dr. Talbert Nan for review as she may need to restart her pills for another year.

## 2017-09-19 NOTE — Telephone Encounter (Signed)
Call to patient. Advised of instructions from Dr Sabra Heck. Patient agreeable to use condoms. Will call her back after Dr Talbert Nan reviews call/concerns.

## 2017-09-22 MED ORDER — NORETHIN ACE-ETH ESTRAD-FE 1-20 MG-MCG PO TABS: 1.0000 | ORAL_TABLET | Freq: Every day | ORAL | 3 refills | Status: DC

## 2017-09-22 NOTE — Telephone Encounter (Signed)
Spoke with patient. Patient would like to restart on OCP. Rx for Loestrin 1/20 FE #3 3RF sent to pharmacy on file. Patient is aware to use a BUM for the first 7 days of OCP.  Routing to provider for final review. Patient agreeable to disposition. Will close encounter.

## 2017-09-22 NOTE — Telephone Encounter (Signed)
It sounds like she is cycling lightly. I think she is still perimenopausal. Her options are to continue to track her bleeding and use condoms (chance of pregnancy is very small, but not zero), or to restart on the low dose OCP's.  If she wants to restart OCP's, please call in loestrin 1/20 fe, 3 packs with 3 refills.

## 2017-10-01 ENCOUNTER — Telehealth: Payer: Self-pay

## 2017-10-01 ENCOUNTER — Encounter: Payer: Self-pay | Admitting: Physician Assistant

## 2017-10-01 ENCOUNTER — Encounter: Payer: Self-pay | Admitting: Obstetrics and Gynecology

## 2017-10-01 NOTE — Telephone Encounter (Signed)
error 

## 2017-10-01 NOTE — Telephone Encounter (Signed)
MyChart Message  Non-Urgent Medical Question  Message [id number]  From Evann, Koelzer To Salvadore Dom, MD Sent 10/01/2017 2:48 PM Dr. Talbert Nan had me restart birth control. Since I had spotted the week before, I went ahead and started the prescription immediately, which was last Monday. I started spotting again this Monday and I am experiencing some cramping. For next month, I may need to make some adjustments to the birth control pill that I have been prescribed. Thanks. Kerri Murphy   Responsible Party   Pool - [facility] Clinical Pool No one has taken responsibility for this message. No actions have been taken on this message.    Called patient to clarify symptoms. She states she restarted OCPs on 09-22-17 and began spotting and having slight cramping on 09-29-17 and thinks she needs a "stronger"pill. She states hasn't missed any pills or taken any late. Advised patient this is okay and it may take 1-2 cycles to regulate. Reassured patient spotting after one week of restarting was not uncommon. Call if bleeding persists. Will let Dr.Jertson know and call with any further recommendations. Routed to provider.

## 2017-10-02 NOTE — Telephone Encounter (Signed)
Spoke with patient and notified her of further information from Dr. Talbert Nan. She will calendar any bleeding and call if any bleeding after 3rd pack of pills.

## 2017-10-02 NOTE — Telephone Encounter (Signed)
I would advise her that it could take up to 3 months for her cycles to regulate. I would not recommend that she be on high dose pills. The high dose pills increase her risks, including of blood clots. Please have her take her pills at the same time daily, calendar any bleeding and call if she isn't doing better by the end of the 3rd pack. Then please close the encounter.

## 2017-11-27 ENCOUNTER — Encounter: Payer: Self-pay | Admitting: Physician Assistant

## 2017-11-27 ENCOUNTER — Ambulatory Visit: Payer: BC Managed Care – PPO | Admitting: Physician Assistant

## 2017-11-27 VITALS — BP 128/76 | HR 96 | Temp 98.1°F | Resp 16 | Ht 68.0 in | Wt 135.8 lb

## 2017-11-27 DIAGNOSIS — N390 Urinary tract infection, site not specified: Secondary | ICD-10-CM | POA: Diagnosis not present

## 2017-11-27 DIAGNOSIS — R8290 Unspecified abnormal findings in urine: Secondary | ICD-10-CM

## 2017-11-27 DIAGNOSIS — R300 Dysuria: Secondary | ICD-10-CM

## 2017-11-27 DIAGNOSIS — R350 Frequency of micturition: Secondary | ICD-10-CM | POA: Diagnosis not present

## 2017-11-27 LAB — POC MICROSCOPIC URINALYSIS (UMFC)

## 2017-11-27 LAB — POCT URINALYSIS DIP (MANUAL ENTRY)
Bilirubin, UA: NEGATIVE
Blood, UA: NEGATIVE
Glucose, UA: NEGATIVE mg/dL
NITRITE UA: POSITIVE — AB
PH UA: 6.5 (ref 5.0–8.0)
PROTEIN UA: NEGATIVE mg/dL
Spec Grav, UA: 1.02 (ref 1.010–1.025)
UROBILINOGEN UA: 0.2 U/dL

## 2017-11-27 MED ORDER — CIPROFLOXACIN HCL 500 MG PO TABS: 500.0000 mg | ORAL_TABLET | Freq: Two times a day (BID) | ORAL | 0 refills | Status: AC

## 2017-11-27 NOTE — Progress Notes (Signed)
PRIMARY CARE AT [facility] [address], [place] [address] [phone]  Date:  11/27/2017   Name:  Kerri Murphy   DOB:  10-20-1966   MRN:  [id number]  PCP:  Mancel Bale, PA-C    History of Present Illness:  Kerri Murphy is a 52 y.o. female patient who presents to PCP with  Chief Complaint  Patient presents with  . Dysuria  . urine odor  . Urinary Frequency     2 weeks of urinary pain, and urgency.   The smell is still present.  The frequency, and urinary pain has resolved.   No abdominal pain, nausea, or fever.   No abnormal vaginal discharge.     Patient Active Problem List   Diagnosis Date Noted  . Overactive bladder 10/06/2012  . Congenital hypothyroidism without goiter 02/18/2012  . ADD (attention deficit disorder) 02/17/2012  . Major depressive disorder 11/17/2006  . DYSPLASIA, CERVIX, MILD 11/07/2006    Past Medical History:  Diagnosis Date  . Anemia   . Depression   . Fibroid   . Mood disorder ([place])   . Thyroid disease     Past Surgical History:  Procedure Laterality Date  . APPENDECTOMY      Social History   Tobacco Use  . Smoking status: Never Smoker  . Smokeless tobacco: Never Used  Substance Use Topics  . Alcohol use: Yes    Comment: social - 1-2 drinks per month  . Drug use: No    Family History  Problem Relation Age of Onset  . Heart disease Father   . Hyperlipidemia Mother   . Mental retardation Mother   . Heart disease Maternal Grandmother   . Cancer Maternal Grandfather        pancreatice  . Alzheimer's disease Paternal Grandmother   . Cancer Paternal Grandfather        colon    Allergies  Allergen Reactions  . Myrbetriq [Mirabegron] Anaphylaxis    Fatigue, HA, increased depression and did not work    Medication list has been reviewed and updated.  Current Outpatient Medications on File Prior to Visit  Medication Sig Dispense Refill  . amphetamine-dextroamphetamine (ADDERALL) 20 MG tablet Take 1 tablet (20 mg  total) by mouth 2 (two) times daily. Two tablets twice a day 60 tablet 0  . buPROPion (WELLBUTRIN XL) 150 MG 24 hr tablet Take 150 mg by mouth daily.    . divalproex (DEPAKOTE ER) 500 MG 24 hr tablet TAKE 2 TABLETS DAILY 180 tablet 4  . fesoterodine (TOVIAZ) 8 MG TB24 tablet Take 1 tablet (8 mg total) by mouth daily. 90 tablet 3  . levothyroxine (SYNTHROID, LEVOTHROID) 88 MCG tablet Take 88 mcg by mouth daily before breakfast.    . norethindrone-ethinyl estradiol (JUNEL FE,GILDESS FE,LOESTRIN FE) 1-20 MG-MCG tablet Take 1 tablet by mouth daily. 3 Package 3   No current facility-administered medications on file prior to visit.     ROS ROS otherwise unremarkable unless listed above.  Physical Examination: BP 128/76   Pulse 96   Temp 98.1 F (36.7 C) (Oral)   Resp 16   Ht 5\' 8"  (1.727 m)   Wt 135 lb 12.8 oz (61.6 kg)   SpO2 99%   BMI 20.65 kg/m  Ideal Body Weight: Weight in (lb) to have BMI = 25: 164.1  Physical Exam  Constitutional: She is oriented to person, place, and time. She appears well-developed and well-nourished. No distress.  HENT:  Head: Normocephalic and atraumatic.  Right  Ear: External ear normal.  Left Ear: External ear normal.  Eyes: Conjunctivae and EOM are normal. Pupils are equal, round, and reactive to light.  Cardiovascular: Normal rate.  Pulmonary/Chest: Effort normal. No respiratory distress.  Abdominal: Soft. Normal appearance and bowel sounds are normal. There is tenderness in the suprapubic area.  Neurological: She is alert and oriented to person, place, and time.  Skin: She is not diaphoretic.  Psychiatric: She has a normal mood and affect. Her behavior is normal.    Results for orders placed or performed in visit on 11/27/17  POCT Microscopic Urinalysis ([facility])  Result Value Ref Range   WBC,UR,HPF,POC Moderate (A) None WBC/hpf   RBC,UR,HPF,POC None None RBC/hpf   Bacteria Too numerous to count  None, Too numerous to count   Mucus Present (A)  Absent   Epithelial Cells, UR Per Microscopy Moderate (A) None, Too numerous to count cells/hpf  POCT urinalysis dipstick  Result Value Ref Range   Color, UA yellow yellow   Clarity, UA clear clear   Glucose, UA negative negative mg/dL   Bilirubin, UA negative negative   Ketones, POC UA trace (5) (A) negative mg/dL   Spec Grav, UA 1.020 1.010 - 1.025   Blood, UA negative negative   pH, UA 6.5 5.0 - 8.0   Protein Ur, POC negative negative mg/dL   Urobilinogen, UA 0.2 0.2 or 1.0 E.U./dL   Nitrite, UA Positive (A) Negative   Leukocytes, UA Trace (A) Negative  \   Assessment and Plan: Kerri Murphy is a 52 y.o. female who is here today for cc of  Chief Complaint  Patient presents with  . Dysuria  . urine odor  . Urinary Frequency   Urinary tract infection without hematuria, site unspecified - Plan: ciprofloxacin (CIPRO) 500 MG tablet  Abnormal urine odor - Plan: POCT Microscopic Urinalysis ([facility]), POCT urinalysis dipstick, Urine Culture  Urinary frequency - Plan: POCT Microscopic Urinalysis ([facility]), POCT urinalysis dipstick, Urine Culture  Dysuria - Plan: POCT Microscopic Urinalysis ([facility]), POCT urinalysis dipstick, Urine Culture  Ivar Drape, PA-C [facility] 2/26/20197:46 AM

## 2017-11-27 NOTE — Patient Instructions (Addendum)
   Urinary Tract Infection, Adult A urinary tract infection (UTI) is an infection of any part of the urinary tract. The urinary tract includes the:  Kidneys.  Ureters.  Bladder.  Urethra.  These organs make, store, and get rid of pee (urine) in the body. Follow these instructions at home:  Take over-the-counter and prescription medicines only as told by your doctor.  If you were prescribed an antibiotic medicine, take it as told by your doctor. Do not stop taking the antibiotic even if you start to feel better.  Avoid the following drinks: ? Alcohol. ? Caffeine. ? Tea. ? Carbonated drinks.  Drink enough fluid to keep your pee clear or pale yellow.  Keep all follow-up visits as told by your doctor. This is important.  Make sure to: ? Empty your bladder often and completely. Do not to hold pee for long periods of time. ? Empty your bladder before and after sex. ? Wipe from front to back after a bowel movement if you are female. Use each tissue one time when you wipe. Contact a doctor if:  You have back pain.  You have a fever.  You feel sick to your stomach (nauseous).  You throw up (vomit).  Your symptoms do not get better after 3 days.  Your symptoms go away and then come back. Get help right away if:  You have very bad back pain.  You have very bad lower belly (abdominal) pain.  You are throwing up and cannot keep down any medicines or water. This information is not intended to replace advice given to you by your health care provider. Make sure you discuss any questions you have with your health care provider. Document Released: 03/25/2008 Document Revised: 03/14/2016 Document Reviewed: 08/28/2015 Elsevier Interactive Patient Education  2018 Elsevier Inc.     IF you received an x-ray today, you will receive an invoice from [facility]. Please contact [facility] at [phone] with questions or concerns regarding your invoice.   IF you  received labwork today, you will receive an invoice from [facility]. Please contact [facility] at [phone] with questions or concerns regarding your invoice.   Our billing staff will not be able to assist you with questions regarding bills from these companies.  You will be contacted with the lab results as soon as they are available. The fastest way to get your results is to activate your My Chart account. Instructions are located on the last page of this paperwork. If you have not heard from us regarding the results in 2 weeks, please contact this office.      

## 2017-11-29 LAB — URINE CULTURE

## 2018-01-28 ENCOUNTER — Encounter: Payer: Self-pay | Admitting: Physician Assistant

## 2018-05-01 ENCOUNTER — Encounter: Payer: Self-pay | Admitting: Physician Assistant

## 2018-05-01 DIAGNOSIS — N3281 Overactive bladder: Secondary | ICD-10-CM

## 2018-05-01 DIAGNOSIS — Z13220 Encounter for screening for lipoid disorders: Secondary | ICD-10-CM

## 2018-05-05 MED ORDER — FESOTERODINE FUMARATE ER 8 MG PO TB24: 8.0000 mg | ORAL_TABLET | Freq: Every day | ORAL | 0 refills | Status: DC

## 2018-05-05 MED ORDER — DIVALPROEX SODIUM ER 500 MG PO TB24: ORAL_TABLET | ORAL | 0 refills | Status: DC

## 2018-05-14 ENCOUNTER — Encounter: Payer: Self-pay | Admitting: Physician Assistant

## 2018-05-14 ENCOUNTER — Other Ambulatory Visit: Payer: Self-pay

## 2018-05-14 ENCOUNTER — Ambulatory Visit (INDEPENDENT_AMBULATORY_CARE_PROVIDER_SITE_OTHER): Payer: BC Managed Care – PPO | Admitting: Physician Assistant

## 2018-05-14 VITALS — BP 102/60 | HR 85 | Temp 98.4°F | Resp 18 | Ht 68.0 in | Wt 131.0 lb

## 2018-05-14 DIAGNOSIS — Z13220 Encounter for screening for lipoid disorders: Secondary | ICD-10-CM | POA: Diagnosis not present

## 2018-05-14 DIAGNOSIS — Z130 Encounter for screening for diseases of the blood and blood-forming organs and certain disorders involving the immune mechanism: Secondary | ICD-10-CM | POA: Diagnosis not present

## 2018-05-14 DIAGNOSIS — Z13228 Encounter for screening for other metabolic disorders: Secondary | ICD-10-CM

## 2018-05-14 DIAGNOSIS — N3281 Overactive bladder: Secondary | ICD-10-CM

## 2018-05-14 DIAGNOSIS — Z0000 Encounter for general adult medical examination without abnormal findings: Secondary | ICD-10-CM

## 2018-05-14 DIAGNOSIS — IMO0001 Reserved for inherently not codable concepts without codable children: Secondary | ICD-10-CM

## 2018-05-14 DIAGNOSIS — Z131 Encounter for screening for diabetes mellitus: Secondary | ICD-10-CM

## 2018-05-14 DIAGNOSIS — F332 Major depressive disorder, recurrent severe without psychotic features: Secondary | ICD-10-CM

## 2018-05-14 MED ORDER — DIVALPROEX SODIUM ER 500 MG PO TB24: ORAL_TABLET | ORAL | 4 refills | Status: AC

## 2018-05-14 MED ORDER — NORETHIN ACE-ETH ESTRAD-FE 1-20 MG-MCG PO TABS: 1.0000 | ORAL_TABLET | Freq: Every day | ORAL | 3 refills | Status: DC

## 2018-05-14 MED ORDER — FESOTERODINE FUMARATE ER 8 MG PO TB24: 8.0000 mg | ORAL_TABLET | Freq: Every day | ORAL | 4 refills | Status: AC

## 2018-05-14 NOTE — Progress Notes (Signed)
Kerri Murphy  MRN: [id number] DOB: Jul 14, 1966  PCP: Mancel Bale, PA-C   Chief Complaint  Patient presents with  . Annual Exam    Subjective:  Pt presents to clinic for a CPE.  Last dental exam: every 6 months Last vision exam: last month Last pap: 2018 Last mammo: does not plan on getting one Last colonoscopy: cologuard UTD Vaccinations - UTD  Typical meals for patient: 3 meals with snacks Typical beverage choices: water Exercises: 3 times per week for 2-3 hours a time road biking Sleeps: 8-9 hrs per night and sleeping well   Patient Active Problem List   Diagnosis Date Noted  . Primary hypothyroidism 01/16/2017  . Overactive bladder 10/06/2012  . ADD (attention deficit disorder) 02/17/2012  . Major depressive disorder 11/17/2006  . DYSPLASIA, CERVIX, MILD 11/07/2006    Patient Care Team: Mittie Bodo as PCP - General (Physician Assistant) Gabriel Carina, MD (Psychiatry) Altheimer, Legrand Como, MD as Referring Physician (Endocrinology)  Review of Systems  Constitutional: Negative.   HENT: Negative.   Eyes: Negative.   Respiratory: Negative.   Cardiovascular: Negative.   Gastrointestinal: Negative.   Endocrine: Negative.   Genitourinary: Negative.   Musculoskeletal: Negative.   Skin: Negative.   Allergic/Immunologic: Negative.   Neurological: Negative.   Hematological: Negative.   Psychiatric/Behavioral: Negative.      Current Outpatient Medications on File Prior to Visit  Medication Sig Dispense Refill  . amphetamine-dextroamphetamine (ADDERALL) 20 MG tablet Take 1 tablet (20 mg total) by mouth 2 (two) times daily. Two tablets twice a day 60 tablet 0  . buPROPion (WELLBUTRIN XL) 150 MG 24 hr tablet Take 150 mg by mouth daily.    Marland Kitchen levothyroxine (SYNTHROID, LEVOTHROID) 88 MCG tablet Take 88 mcg by mouth daily before breakfast.     No current facility-administered medications on file prior to visit.     Allergies  Allergen Reactions  .  Myrbetriq [Mirabegron] Anaphylaxis    Fatigue, HA, increased depression and did not work    Social History   Socioeconomic History  . Marital status: Divorced    Spouse name: Not on file  . Number of children: Not on file  . Years of education: Not on file  . Highest education level: Not on file  Occupational History  . Occupation: professor    Employer: [employer]  . Financial resource strain: Not on file  . Food insecurity:    Worry: Not on file    Inability: Not on file  . Transportation needs:    Medical: Not on file    Non-medical: Not on file  Tobacco Use  . Smoking status: Never Smoker  . Smokeless tobacco: Never Used  Substance and Sexual Activity  . Alcohol use: Yes    Comment: social - 1-2 drinks per month  . Drug use: No  . Sexual activity: Yes    Partners: Male    Birth control/protection: Pill  Lifestyle  . Physical activity:    Days per week: Not on file    Minutes per session: Not on file  . Stress: Not on file  Relationships  . Social connections:    Talks on phone: Not on file    Gets together: Not on file    Attends religious service: Not on file    Active member of club or organization: Not on file    Attends meetings of clubs or organizations: Not on file    Relationship status: Not on  file  Other Topics Concern  . 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 APPENDECTOMY      Family History  Problem Relation Age of Onset  . Heart disease Father   . Hyperlipidemia Mother   . Mental retardation Mother   . Heart disease Maternal Grandmother   . Cancer Maternal Grandfather        pancreatice  . Alzheimer's disease Paternal Grandmother   . Cancer Paternal Grandfather        colon     Objective:  BP 102/60    Pulse 85   Temp 98.4 F (36.9 C) (Oral)   Resp 18   Ht _0  (1.727 m)   Wt 131 lb (59.4 kg)   LMP 04/27/2018   SpO2 100%   BMI 19.92 kg/m   Physical Exam  Constitutional: She is oriented to person, place, and time. She appears well-developed and well-nourished.  HENT:  Head: Normocephalic and atraumatic.  Right Ear: Hearing, tympanic membrane, external ear and ear canal normal.  Left Ear: Hearing, tympanic membrane, external ear and ear canal normal.  Nose: Nose normal.  Mouth/Throat: Uvula is midline, oropharynx is clear and moist and mucous membranes are normal.  Eyes: Pupils are equal, round, and reactive to light. Conjunctivae, EOM and lids are normal. Right eye exhibits no discharge. Left eye exhibits no discharge.  Neck: Trachea normal and normal range of motion. Neck supple. No thyroid mass and no thyromegaly present.  Cardiovascular: Normal rate, regular rhythm and normal heart sounds.  No murmur heard. Pulmonary/Chest: Effort normal and breath sounds normal. She has no wheezes. Right breast exhibits no inverted nipple, no mass, no nipple discharge, no skin change and no tenderness. Left breast exhibits no inverted nipple, no mass, no nipple discharge, no skin change and no tenderness.  Abdominal: Soft. Normal appearance and bowel sounds are normal. There is no tenderness.  Musculoskeletal: Normal range of motion.  Lymphadenopathy:       Head (right side): No tonsillar, no preauricular, no posterior auricular and no occipital adenopathy present.       Head (left side): No tonsillar, no preauricular, no posterior auricular and no occipital adenopathy present.    She has no cervical adenopathy.       Right: No supraclavicular adenopathy present.       Left: No supraclavicular adenopathy present.  Neurological: She is alert and oriented to person, place, and time. She has normal strength and normal reflexes.  Skin: Skin is warm, dry and intact.  Psychiatric: She has a normal  mood and affect. Her speech is normal and behavior is normal. Judgment and thought content normal.  Vitals reviewed.   Wt Readings from Last 3 Encounters:  05/14/18 131 lb (59.4 kg)  11/27/17 135 lb 12.8 oz (61.6 kg)  07/08/17 139 lb (63 kg)     Visual Acuity Screening   Right eye Left eye Both eyes  Without correction:  20/200 20/25  With correction: 20/25      Assessment and Plan :  Annual physical exam - anticipatory guidance  Overactive bladder - Plan: fesoterodine (TOVIAZ) 8 MG TB24 tablet - good control with medication - continue  Screening cholesterol level - check  labs  Screening for deficiency anemia - Plan: CBC with Differential/Platelet - check labs  Screening for metabolic disorder - Plan: CMP14+EGFR - check labs  Screening for diabetes mellitus - Plan: Hemoglobin A1C - check labs  Birth control - Plan: norethindrone-ethinyl estradiol (JUNEL FE,GILDESS FE,LOESTRIN FE) 1-20 MG-MCG tablet - continue good control  Screening, lipid - Plan: Lipid panel  Severe episode of recurrent major depressive disorder, without psychotic features ([place]) - Plan: divalproex (DEPAKOTE ER) 500 MG 24 hr tablet - pt also sees dr Toy Cookey who she plans to continue seeing - he writes for her other medications.  Windell Hummingbird PA-C  [facility] 05/14/2018 9:52 AM  Please note: Portions of this report may have been transcribed using dragon voice recognition software. Every effort was made to ensure accuracy; however, inadvertent computerized transcription errors may be present.

## 2018-05-14 NOTE — Patient Instructions (Addendum)
[address], [place], [place] [address] Phone: [phone]  I will contact you with your lab results as soon as they are available.   If you have not heard from me in 2 weeks, please contact me.  The fastest way to get your results is to register for My Chart (see the instructions on this printout).    IF you received an x-ray today, you will receive an invoice from [facility]. Please contact [facility] at [phone] with questions or concerns regarding your invoice.   IF you received labwork today, you will receive an invoice from [facility]. Please contact [facility] at [phone] with questions or concerns regarding your invoice.   Our billing staff will not be able to assist you with questions regarding bills from these companies.  You will be contacted with the lab results as soon as they are available. The fastest way to get your results is to activate your My Chart account. Instructions are located on the last page of this paperwork. If you have not heard from [place] regarding the results in 2 weeks, please contact this office.    Health Maintenance, Female Adopting a healthy lifestyle and getting preventive care can go a long way to promote health and wellness. Talk with your health care provider about what schedule of regular examinations is right for you. This is a good chance for you to check in with your provider about disease prevention and staying healthy. In between checkups, there are plenty of things you can do on your own. Experts have done a lot of research about which lifestyle changes and preventive measures are most likely to keep you healthy. Ask your health care provider for more information. Weight and diet Eat a healthy diet  Be sure to include plenty of vegetables, fruits, low-fat dairy products, and lean protein.  Do not eat a lot of foods high in solid fats, added sugars, or salt.  Get regular exercise.  This is one of the most important things you can do for your health. ? Most adults should exercise for at least 150 minutes each week. The exercise should increase your heart rate and make you sweat (moderate-intensity exercise). ? Most adults should also do strengthening exercises at least twice a week. This is in addition to the moderate-intensity exercise.  Maintain a healthy weight  Body mass index (BMI) is a measurement that can be used to identify possible weight problems. It estimates body fat based on height and weight. Your health care provider can help determine your BMI and help you achieve or maintain a healthy weight.  For females 62 years of age and older: ? A BMI below 18.5 is considered underweight. ? A BMI of 18.5 to 24.9 is normal. ? A BMI of 25 to 29.9 is considered overweight. ? A BMI of 30 and above is considered obese.  Watch levels of cholesterol and blood lipids  You should start having your blood tested for lipids and cholesterol at 51 years of age, then have this test every 5 years.  You may need to have your cholesterol levels checked more often if: ? Your lipid or cholesterol levels are high. ? You are older than 51 years of age. ? You are at high risk for heart disease.  Cancer screening Lung Cancer  Lung cancer screening is recommended for adults 75-69 years old who are at high risk for lung cancer because of a history of smoking.  A  yearly low-dose CT scan of the lungs is recommended for people who: ? Currently smoke. ? Have quit within the past 15 years. ? Have at least a 30-pack-year history of smoking. A pack year is smoking an average of one pack of cigarettes a day for 1 year.  Yearly screening should continue until it has been 15 years since you quit.  Yearly screening should stop if you develop a health problem that would prevent you from having lung cancer treatment.  Breast Cancer  Practice breast self-awareness. This means understanding  how your breasts normally appear and feel.  It also means doing regular breast self-exams. Let your health care provider know about any changes, no matter how small.  If you are in your 20s or 30s, you should have a clinical breast exam (CBE) by a health care provider every 1-3 years as part of a regular health exam.  If you are 6 or older, have a CBE every year. Also consider having a breast X-ray (mammogram) every year.  If you have a family history of breast cancer, talk to your health care provider about genetic screening.  If you are at high risk for breast cancer, talk to your health care provider about having an MRI and a mammogram every year.  Breast cancer gene (BRCA) assessment is recommended for women who have family members with BRCA-related cancers. BRCA-related cancers include: ? Breast. ? Ovarian. ? Tubal. ? Peritoneal cancers.  Results of the assessment will determine the need for genetic counseling and BRCA1 and BRCA2 testing.  Cervical Cancer Your health care provider may recommend that you be screened regularly for cancer of the pelvic organs (ovaries, uterus, and vagina). This screening involves a pelvic examination, including checking for microscopic changes to the surface of your cervix (Pap test). You may be encouraged to have this screening done every 3 years, beginning at age 13.  For women ages 73-65, health care providers may recommend pelvic exams and Pap testing every 3 years, or they may recommend the Pap and pelvic exam, combined with testing for human papilloma virus (HPV), every 5 years. Some types of HPV increase your risk of cervical cancer. Testing for HPV may also be done on women of any age with unclear Pap test results.  Other health care providers may not recommend any screening for nonpregnant women who are considered low risk for pelvic cancer and who do not have symptoms. Ask your health care provider if a screening pelvic exam is right for  you.  If you have had past treatment for cervical cancer or a condition that could lead to cancer, you need Pap tests and screening for cancer for at least 20 years after your treatment. If Pap tests have been discontinued, your risk factors (such as having a new sexual partner) need to be reassessed to determine if screening should resume. Some women have medical problems that increase the chance of getting cervical cancer. In these cases, your health care provider may recommend more frequent screening and Pap tests.  Colorectal Cancer  This type of cancer can be detected and often prevented.  Routine colorectal cancer screening usually begins at 51 years of age and continues through 51 years of age.  Your health care provider may recommend screening at an earlier age if you have risk factors for colon cancer.  Your health care provider may also recommend using home test kits to check for hidden blood in the stool.  A small camera at the end of  a tube can be used to examine your colon directly (sigmoidoscopy or colonoscopy). This is done to check for the earliest forms of colorectal cancer.  Routine screening usually begins at age 7.  Direct examination of the colon should be repeated every 5-10 years through 51 years of age. However, you may need to be screened more often if early forms of precancerous polyps or small growths are found.  Skin Cancer  Check your skin from head to toe regularly.  Tell your health care provider about any new moles or changes in moles, especially if there is a change in a mole's shape or color.  Also tell your health care provider if you have a mole that is larger than the size of a pencil eraser.  Always use sunscreen. Apply sunscreen liberally and repeatedly throughout the day.  Protect yourself by wearing long sleeves, pants, a wide-brimmed hat, and sunglasses whenever you are outside.  Heart disease, diabetes, and high blood pressure  High blood  pressure causes heart disease and increases the risk of stroke. High blood pressure is more likely to develop in: ? People who have blood pressure in the high end of the normal range (130-139/85-89 mm Hg). ? People who are overweight or obese. ? People who are African American.  If you are 50-33 years of age, have your blood pressure checked every 3-5 years. If you are 24 years of age or older, have your blood pressure checked every year. You should have your blood pressure measured twice-once when you are at a hospital or clinic, and once when you are not at a hospital or clinic. Record the average of the two measurements. To check your blood pressure when you are not at a hospital or clinic, you can use: ? An automated blood pressure machine at a pharmacy. ? A home blood pressure monitor.  If you are between 8 years and 22 years old, ask your health care provider if you should take aspirin to prevent strokes.  Have regular diabetes screenings. This involves taking a blood sample to check your fasting blood sugar level. ? If you are at a normal weight and have a low risk for diabetes, have this test once every three years after 51 years of age. ? If you are overweight and have a high risk for diabetes, consider being tested at a younger age or more often. Preventing infection Hepatitis B  If you have a higher risk for hepatitis B, you should be screened for this virus. You are considered at high risk for hepatitis B if: ? You were born in a country where hepatitis B is common. Ask your health care provider which countries are considered high risk. ? Your parents were born in a high-risk country, and you have not been immunized against hepatitis B (hepatitis B vaccine). ? You have HIV or AIDS. ? You use needles to inject street drugs. ? You live with someone who has hepatitis B. ? You have had sex with someone who has hepatitis B. ? You get hemodialysis treatment. ? You take certain  medicines for conditions, including cancer, organ transplantation, and autoimmune conditions.  Hepatitis C  Blood testing is recommended for: ? Everyone born from [birth year] through 1964. ? Anyone with known risk factors for hepatitis C.  Sexually transmitted infections (STIs)  You should be screened for sexually transmitted infections (STIs) including gonorrhea and chlamydia if: ? You are sexually active and are younger than 51 years of age. ? You are older  than 51 years of age and your health care provider tells you that you are at risk for this type of infection. ? Your sexual activity has changed since you were last screened and you are at an increased risk for chlamydia or gonorrhea. Ask your health care provider if you are at risk.  If you do not have HIV, but are at risk, it may be recommended that you take a prescription medicine daily to prevent HIV infection. This is called pre-exposure prophylaxis (PrEP). You are considered at risk if: ? You are sexually active and do not regularly use condoms or know the HIV status of your partner(s). ? You take drugs by injection. ? You are sexually active with a partner who has HIV.  Talk with your health care provider about whether you are at high risk of being infected with HIV. If you choose to begin PrEP, you should first be tested for HIV. You should then be tested every 3 months for as long as you are taking PrEP. Pregnancy  If you are premenopausal and you may become pregnant, ask your health care provider about preconception counseling.  If you may become pregnant, take 400 to 800 micrograms (mcg) of folic acid every day.  If you want to prevent pregnancy, talk to your health care provider about birth control (contraception). Osteoporosis and menopause  Osteoporosis is a disease in which the bones lose minerals and strength with aging. This can result in serious bone fractures. Your risk for osteoporosis can be identified using a bone  density scan.  If you are 34 years of age or older, or if you are at risk for osteoporosis and fractures, ask your health care provider if you should be screened.  Ask your health care provider whether you should take a calcium or vitamin D supplement to lower your risk for osteoporosis.  Menopause may have certain physical symptoms and risks.  Hormone replacement therapy may reduce some of these symptoms and risks. Talk to your health care provider about whether hormone replacement therapy is right for you. Follow these instructions at home:  Schedule regular health, dental, and eye exams.  Stay current with your immunizations.  Do not use any tobacco products including cigarettes, chewing tobacco, or electronic cigarettes.  If you are pregnant, do not drink alcohol.  If you are breastfeeding, limit how much and how often you drink alcohol.  Limit alcohol intake to no more than 1 drink per day for nonpregnant women. One drink equals 12 ounces of beer, 5 ounces of wine, or 1 ounces of hard liquor.  Do not use street drugs.  Do not share needles.  Ask your health care provider for help if you need support or information about quitting drugs.  Tell your health care provider if you often feel depressed.  Tell your health care provider if you have ever been abused or do not feel safe at home. This information is not intended to replace advice given to you by your health care provider. Make sure you discuss any questions you have with your health care provider. Document Released: 04/22/2011 Document Revised: 03/14/2016 Document Reviewed: 07/11/2015 Elsevier Interactive Patient Education  [employer].

## 2018-05-15 LAB — CBC WITH DIFFERENTIAL/PLATELET
BASOS ABS: 0 10*3/uL (ref 0.0–0.2)
Basos: 0 %
EOS (ABSOLUTE): 0 10*3/uL (ref 0.0–0.4)
Eos: 1 %
Hematocrit: 38.8 % (ref 34.0–46.6)
Hemoglobin: 12.6 g/dL (ref 11.1–15.9)
Immature Grans (Abs): 0 10*3/uL (ref 0.0–0.1)
Immature Granulocytes: 0 %
Lymphocytes Absolute: 2 10*3/uL (ref 0.7–3.1)
Lymphs: 36 %
MCH: 29.5 pg (ref 26.6–33.0)
MCHC: 32.5 g/dL (ref 31.5–35.7)
MCV: 91 fL (ref 79–97)
MONOS ABS: 0.4 10*3/uL (ref 0.1–0.9)
Monocytes: 7 %
NEUTROS ABS: 3 10*3/uL (ref 1.4–7.0)
Neutrophils: 56 %
Platelets: 261 10*3/uL (ref 150–450)
RBC: 4.27 x10E6/uL (ref 3.77–5.28)
RDW: 12.4 % (ref 12.3–15.4)
WBC: 5.4 10*3/uL (ref 3.4–10.8)

## 2018-05-15 LAB — LIPID PANEL
Chol/HDL Ratio: 2.6 ratio (ref 0.0–4.4)
Cholesterol, Total: 207 mg/dL — ABNORMAL HIGH (ref 100–199)
HDL: 79 mg/dL (ref >39)
LDL Calculated: 112 mg/dL — ABNORMAL HIGH (ref 0–99)
Triglycerides: 80 mg/dL (ref 0–149)
VLDL CHOLESTEROL CAL: 16 mg/dL (ref 5–40)

## 2018-05-15 LAB — CMP14+EGFR
A/G RATIO: 1.6 (ref 1.2–2.2)
ALBUMIN: 4.2 g/dL (ref 3.5–5.5)
ALT: 12 IU/L (ref 0–32)
AST: 14 IU/L (ref 0–40)
Alkaline Phosphatase: 44 IU/L (ref 39–117)
BILIRUBIN TOTAL: 0.3 mg/dL (ref 0.0–1.2)
BUN / CREAT RATIO: 13 (ref 9–23)
BUN: 14 mg/dL (ref 6–24)
CHLORIDE: 103 mmol/L (ref 96–106)
CO2: 24 mmol/L (ref 20–29)
Calcium: 9.7 mg/dL (ref 8.7–10.2)
Creatinine, Ser: 1.1 mg/dL — ABNORMAL HIGH (ref 0.57–1.00)
GFR calc Af Amer: 67 mL/min/{1.73_m2} (ref >59)
GFR calc non Af Amer: 58 mL/min/{1.73_m2} — ABNORMAL LOW (ref >59)
GLUCOSE: 75 mg/dL (ref 65–99)
Globulin, Total: 2.6 g/dL (ref 1.5–4.5)
POTASSIUM: 4.8 mmol/L (ref 3.5–5.2)
Sodium: 141 mmol/L (ref 134–144)
Total Protein: 6.8 g/dL (ref 6.0–8.5)

## 2018-05-15 LAB — HEMOGLOBIN A1C
ESTIMATED AVERAGE GLUCOSE: 103 mg/dL
Hgb A1c MFr Bld: 5.2 % (ref 4.8–5.6)

## 2018-05-29 ENCOUNTER — Other Ambulatory Visit: Payer: Self-pay | Admitting: Physician Assistant

## 2018-05-29 DIAGNOSIS — N3281 Overactive bladder: Secondary | ICD-10-CM

## 2018-05-29 DIAGNOSIS — Z13220 Encounter for screening for lipoid disorders: Secondary | ICD-10-CM

## 2018-05-29 NOTE — Telephone Encounter (Signed)
Toviaz 8 mg and Depakote ER 500 mg refill Last Refill:Both meds refilled 05/05/18.   Toviaz #90;  Depakote # 180 Last OV: 05/14/18 PCP: Windell Hummingbird Pharmacy:

## 2018-07-26 ENCOUNTER — Other Ambulatory Visit: Payer: Self-pay | Admitting: Obstetrics and Gynecology

## 2018-07-27 NOTE — Telephone Encounter (Signed)
Call to patient. Patient states PCP is prescribing Junel for her and it is being sent to [facility]. Patient has appointment with Dr. Talbert Nan on 08-04-18. Advised would refuse refill request from pharmacy. Patient agreeable.

## 2018-08-03 NOTE — Progress Notes (Signed)
52 y.o. [id number] Divorced White or Caucasian Not Hispanic or Latino female here for annual exam.   Period Cycle (Days): 28 Period Duration (Days): 3-4 days Period Pattern: Regular Menstrual Flow: Heavy, Moderate Menstrual Control: Tampon Menstrual Control Change Freq (Hours): changes tampon every 4 hours Dysmenorrhea: (!) Severe Dysmenorrhea Symptoms: Cramping  Cramps have gotten worse with time. She is able to function, declines medication. Sexually active, same partner for a couple of months. No pain with sex, not using condoms. She c/o intermittent vaginal odor for several months. She has had BV many times in the last few years.   Patient's last menstrual period was 07/21/2018 (exact date).          Sexually active: Yes.    The current method of family planning is OCP (estrogen/progesterone).    Exercising: Yes.    cycling Smoker:  no  Health Maintenance: Pap:  07/03/2017 negative pap with positive HPV and negative 16/18/45 testing History of abnormal Pap:  Yes MMG:  Never Colonoscopy:  Never Cologuard: 04-03-17 Normal  BMD:   Never TDaP:  2011 Gardasil: N/A  reports that she has never smoked. She has never used smokeless tobacco. She reports that she drinks alcohol. She reports that she does not use drugs. 1-2 drinks a month. She teaches at [employer], recreation and parks management  Past Medical History:  Diagnosis Date  . Anemia   . Depression   . Fibroid   . Mood disorder ([place])   . Thyroid disease     Past Surgical History:  Procedure Laterality Date  . APPENDECTOMY      Current Outpatient Medications  Medication Sig Dispense Refill  . amphetamine-dextroamphetamine (ADDERALL) 20 MG tablet Take 1 tablet (20 mg total) by mouth 2 (two) times daily. Two tablets twice a day 60 tablet 0  . buPROPion (WELLBUTRIN XL) 150 MG 24 hr tablet Take 150 mg by mouth daily.    . divalproex (DEPAKOTE ER) 500 MG 24 hr tablet TAKE 2 TABLETS DAILY 180 tablet 4  . fesoterodine (TOVIAZ) 8 MG  TB24 tablet Take 1 tablet (8 mg total) by mouth daily. 90 tablet 4  . levothyroxine (SYNTHROID, LEVOTHROID) 88 MCG tablet Take 88 mcg by mouth daily before breakfast.    . norethindrone-ethinyl estradiol (JUNEL FE,GILDESS FE,LOESTRIN FE) 1-20 MG-MCG tablet Take 1 tablet by mouth daily. 3 Package 3   No current facility-administered medications for this visit.     Family History  Problem Relation Age of Onset  . Heart disease Father   . Hyperlipidemia Mother   . Mental retardation Mother   . Heart disease Maternal Grandmother   . Cancer Maternal Grandfather        pancreatice  . Alzheimer's disease Paternal Grandmother   . Cancer Paternal Grandfather        colon    Review of Systems  Constitutional:       Weight loss  HENT: Negative.   Eyes: Negative.   Respiratory: Negative.   Cardiovascular: Negative.   Gastrointestinal: Negative.   Endocrine: Negative.   Genitourinary: Positive for vaginal discharge.       Painful cycles  Musculoskeletal: Negative.   Skin: Negative.   Allergic/Immunologic: Negative.   Neurological: Negative.   Hematological: Negative.   Psychiatric/Behavioral:       Depression  With her depression, she doesn't want to do any preventative screening. She isn't suicidal, but wouldn't prevent her death.   Exam:   BP 112/64 (BP Location: Right Arm, Patient Position: Sitting, Cuff  Size: Normal)   Pulse 76   Ht 5\' 9"  (1.753 m)   Wt 132 lb 3.2 oz (60 kg)   LMP 07/21/2018 (Exact Date)   BMI 19.52 kg/m   Weight change: @WEIGHTCHANGE @ Height:   Height: 5\' 9"  (175.3 cm)  Ht Readings from Last 3 Encounters:  08/04/18 5\' 9"  (1.753 m)  05/14/18 5\' 8"  (1.727 m)  11/27/17 5\' 8"  (1.727 m)    General appearance: alert, cooperative and appears stated age Head: Normocephalic, without obvious abnormality, atraumatic Neck: no adenopathy, supple, symmetrical, trachea midline and thyroid normal to inspection and palpation Lungs: clear to auscultation  bilaterally Cardiovascular: regular rate and rhythm Breasts: normal appearance, no masses or tenderness Abdomen: soft, non-tender; non distended,  no masses,  no organomegaly Extremities: extremities normal, atraumatic, no cyanosis or edema Skin: Skin color, texture, turgor normal. No rashes or lesions Lymph nodes: Cervical, supraclavicular, and axillary nodes normal. No abnormal inguinal nodes palpated Neurologic: Grossly normal   Pelvic: External genitalia:  no lesions              Urethra:  normal appearing urethra with no masses, tenderness or lesions              Bartholins and Skenes: normal                 Vagina: normal appearing vagina with normal color and discharge, no lesions              Cervix: no lesions               Bimanual Exam:  Uterus:  normal size, contour, position, consistency, mobility, non-tender and retroverted              Adnexa: no mass, fullness, tenderness               Rectovaginal: Confirms               Anus:  normal sphincter tone, no lesions  Chaperone was present for exam.  A:  Well Woman with normal exam  Pap last year with HPV, negative for 16/18/45  Mild vaginal odor  P:   Discussed breast self exam  Discussed calcium and vit D intake  Pap with hpv  She has agreed to have a mammogram (needs this to continue with OCP's)  Will check FSH at the end of her placebo week of pills  STD

## 2018-08-04 ENCOUNTER — Ambulatory Visit: Payer: BC Managed Care – PPO | Admitting: Obstetrics and Gynecology

## 2018-08-04 ENCOUNTER — Other Ambulatory Visit: Payer: Self-pay

## 2018-08-04 ENCOUNTER — Other Ambulatory Visit: Payer: Self-pay | Admitting: Obstetrics and Gynecology

## 2018-08-04 ENCOUNTER — Encounter: Payer: Self-pay | Admitting: Obstetrics and Gynecology

## 2018-08-04 ENCOUNTER — Other Ambulatory Visit (HOSPITAL_COMMUNITY)
Admission: RE | Admit: 2018-08-04 | Discharge: 2018-08-04 | Disposition: A | Discharge status: Home / Self Care | Payer: BC Managed Care – PPO | Source: Ambulatory Visit | Attending: Obstetrics and Gynecology | Admitting: Obstetrics and Gynecology

## 2018-08-04 VITALS — BP 112/64 | HR 76 | Ht 69.0 in | Wt 132.2 lb

## 2018-08-04 DIAGNOSIS — Z1231 Encounter for screening mammogram for malignant neoplasm of breast: Secondary | ICD-10-CM

## 2018-08-04 DIAGNOSIS — Z124 Encounter for screening for malignant neoplasm of cervix: Secondary | ICD-10-CM | POA: Diagnosis present

## 2018-08-04 DIAGNOSIS — N898 Other specified noninflammatory disorders of vagina: Secondary | ICD-10-CM | POA: Insufficient documentation

## 2018-08-04 DIAGNOSIS — Z01419 Encounter for gynecological examination (general) (routine) without abnormal findings: Secondary | ICD-10-CM

## 2018-08-04 DIAGNOSIS — Z113 Encounter for screening for infections with a predominantly sexual mode of transmission: Secondary | ICD-10-CM | POA: Insufficient documentation

## 2018-08-04 DIAGNOSIS — Z3041 Encounter for surveillance of contraceptive pills: Secondary | ICD-10-CM

## 2018-08-04 NOTE — Patient Instructions (Signed)
EXERCISE AND DIET:  We recommended that you start or continue a regular exercise program for good health. Regular exercise means any activity that makes your heart beat faster and makes you sweat.  We recommend exercising at least 30 minutes per day at least 3 days a week, preferably 4 or 5.  We also recommend a diet low in fat and sugar.  Inactivity, poor dietary choices and obesity can cause diabetes, heart attack, stroke, and kidney damage, among others.    ALCOHOL AND SMOKING:  Women should limit their alcohol intake to no more than 7 drinks/beers/glasses of wine (combined, not each!) per week. Moderation of alcohol intake to this level decreases your risk of breast cancer and liver damage. And of course, no recreational drugs are part of a healthy lifestyle.  And absolutely no smoking or even second hand smoke. Most people know smoking can cause heart and lung diseases, but did you know it also contributes to weakening of your bones? Aging of your skin?  Yellowing of your teeth and nails?  CALCIUM AND VITAMIN D:  Adequate intake of calcium and Vitamin D are recommended.  The recommendations for exact amounts of these supplements seem to change often, but generally speaking 600 mg of calcium (either carbonate or citrate) and 800 units of Vitamin D per day seems prudent. Certain women may benefit from higher intake of Vitamin D.  If you are among these women, your doctor will have told you during your visit.    PAP SMEARS:  Pap smears, to check for cervical cancer or precancers,  have traditionally been done yearly, although recent scientific advances have shown that most women can have pap smears less often.  However, every woman still should have a physical exam from her gynecologist every year. It will include a breast check, inspection of the vulva and vagina to check for abnormal growths or skin changes, a visual exam of the cervix, and then an exam to evaluate the size and shape of the uterus and  ovaries.  And after 51 years of age, a rectal exam is indicated to check for rectal cancers. We will also provide age appropriate advice regarding health maintenance, like when you should have certain vaccines, screening for sexually transmitted diseases, bone density testing, colonoscopy, mammograms, etc.   MAMMOGRAMS:  All women over 40 years old should have a yearly mammogram. Many facilities now offer a "3D" mammogram, which may cost around $50 extra out of pocket. If possible,  we recommend you accept the option to have the 3D mammogram performed.  It both reduces the number of women who will be called back for extra views which then turn out to be normal, and it is better than the routine mammogram at detecting truly abnormal areas.    COLONOSCOPY:  Colonoscopy to screen for colon cancer is recommended for all women at age 50.  We know, you hate the idea of the prep.  We agree, BUT, having colon cancer and not knowing it is worse!!  Colon cancer so often starts as a polyp that can be seen and removed at colonscopy, which can quite literally save your life!  And if your first colonoscopy is normal and you have no family history of colon cancer, most women don't have to have it again for 10 years.  Once every ten years, you can do something that may end up saving your life, right?  We will be happy to help you get it scheduled when you are ready.    Be sure to check your insurance coverage so you understand how much it will cost.  It may be covered as a preventative service at no cost, but you should check your particular policy.     Mammogram Facilities  Yearly screening mammograms are recommended for women beginning at age 40. For a routine screening mammogram, you may schedule the appointment and have it done at the location of your choice.  Please ask the facility to send the results to our office. (fax [phone]) Location options include:  [facility] [address], [address] [place], [place] [address] [phone]  [facility] [address], [address] , [place] [address] [phone]  

## 2018-08-07 LAB — CYTOLOGY - PAP
Bacterial vaginitis: POSITIVE — AB
CHLAMYDIA, DNA PROBE: NEGATIVE
DIAGNOSIS: NEGATIVE
HPV (WINDOPATH): NOT DETECTED
NEISSERIA GONORRHEA: NEGATIVE
Trichomonas: NEGATIVE

## 2018-08-10 ENCOUNTER — Telehealth: Payer: Self-pay

## 2018-08-10 MED ORDER — METRONIDAZOLE 500 MG PO TABS: 500.0000 mg | ORAL_TABLET | Freq: Two times a day (BID) | ORAL | 0 refills | Status: DC

## 2018-08-10 NOTE — Telephone Encounter (Signed)
Notes recorded by SpragueHarley Hallmark, RN on 08/10/2018 at 9:32 AM EDT Attempted to reach patient at number provided. There was no answer.Unable to leave voicemail. ------  Notes recorded by Salvadore Dom, MD on 08/10/2018 at 7:24 AM EDT Please inform the patient that her pap was + for BV and treat with flagyl (either oral or vaginal, her choice), no ETOH while on Flagyl.  Oral: Flagyl 500 mg BID x 7 days, or Vaginal: Metrogel, 1 applicator per vagina q day x 5 days. Pap, HPV and cervical cultures were negative.  02 recall

## 2018-08-10 NOTE — Telephone Encounter (Signed)
Spoke with patient. Results given. Patient verbalizes understanding. Rx for Flagyl 500 mg BID x 7 days #14 0RF sent to pharmacy on file. Avoid alcohol during treatment and 24 hours after completing medication. Don't mix with alcohol if mixed can cause severe nausea, vomiting and abdominal cramping. Patient verbalizes understanding. 02 recall entered. Encounter closed.

## 2018-08-24 ENCOUNTER — Other Ambulatory Visit: Payer: BC Managed Care – PPO

## 2018-08-24 ENCOUNTER — Other Ambulatory Visit (INDEPENDENT_AMBULATORY_CARE_PROVIDER_SITE_OTHER): Payer: BC Managed Care – PPO

## 2018-08-24 DIAGNOSIS — Z113 Encounter for screening for infections with a predominantly sexual mode of transmission: Secondary | ICD-10-CM

## 2018-08-24 DIAGNOSIS — Z3041 Encounter for surveillance of contraceptive pills: Secondary | ICD-10-CM

## 2018-08-27 LAB — FOLLICLE STIMULATING HORMONE: FSH: 56.3 m[IU]/mL

## 2018-08-27 LAB — ANTI MULLERIAN HORMONE: ANTI-MULLERIAN HORMONE (AMH): <0.015 ng/mL

## 2018-08-27 LAB — HEP, RPR, HIV PANEL
HEP B S AG: NEGATIVE
HIV SCREEN 4TH GENERATION: NONREACTIVE
RPR Ser Ql: NONREACTIVE

## 2018-08-27 LAB — HEPATITIS C ANTIBODY

## 2018-09-11 ENCOUNTER — Ambulatory Visit
Admission: RE | Admit: 2018-09-11 | Discharge: 2018-09-11 | Disposition: A | Discharge status: Home / Self Care | Payer: BC Managed Care – PPO | Source: Ambulatory Visit | Attending: Obstetrics and Gynecology | Admitting: Obstetrics and Gynecology

## 2018-09-11 DIAGNOSIS — Z1231 Encounter for screening mammogram for malignant neoplasm of breast: Secondary | ICD-10-CM

## 2018-09-15 ENCOUNTER — Other Ambulatory Visit: Payer: Self-pay | Admitting: Obstetrics and Gynecology

## 2018-09-15 DIAGNOSIS — R928 Other abnormal and inconclusive findings on diagnostic imaging of breast: Secondary | ICD-10-CM

## 2018-09-28 ENCOUNTER — Ambulatory Visit: Payer: BC Managed Care – PPO

## 2018-09-28 ENCOUNTER — Ambulatory Visit
Admission: RE | Admit: 2018-09-28 | Discharge: 2018-09-28 | Disposition: A | Discharge status: Home / Self Care | Payer: BC Managed Care – PPO | Source: Ambulatory Visit | Attending: Obstetrics and Gynecology | Admitting: Obstetrics and Gynecology

## 2018-09-28 ENCOUNTER — Other Ambulatory Visit: Payer: Self-pay | Admitting: Obstetrics and Gynecology

## 2018-09-28 DIAGNOSIS — R921 Mammographic calcification found on diagnostic imaging of breast: Secondary | ICD-10-CM

## 2018-09-28 DIAGNOSIS — R928 Other abnormal and inconclusive findings on diagnostic imaging of breast: Secondary | ICD-10-CM

## 2019-01-26 ENCOUNTER — Encounter: Payer: Self-pay | Admitting: Obstetrics and Gynecology

## 2019-01-27 ENCOUNTER — Telehealth: Payer: Self-pay | Admitting: Obstetrics and Gynecology

## 2019-01-27 NOTE — Telephone Encounter (Signed)
Spoke with patient.  Stopped OCP in November per Dr. Talbert Nan.  Had period in December, spotting mid February. Bleeding on and off since end of March, for the last two weeks. Changes tampon q 3 hours.  Not symptomatic.  Advised office visit indicated for evaluation of post menopausal bleeding.  Emergency instructions given. Appointment for tomorrow with Dr. Talbert Nan scheduled.   Encounter closed.

## 2019-01-27 NOTE — Telephone Encounter (Signed)
Patient sent the following correspondence through [facility]. Routing to triage to assist patient with request.  Dr. Talbert Nan,  I stopped taking my birth control pills right after you informed me of my hormone levels from my last test. I have had one period since then and lightly spotted once. That is until the last 2 weeks. I have had a full (not terribly heavy, but steady) period for 2 weeks now. Can that be due to stress alone? I am under a lot of stress with work. Just didn't know if I should be concerned or not. Thanks. Kerri Murphy

## 2019-01-28 ENCOUNTER — Other Ambulatory Visit: Payer: Self-pay

## 2019-01-28 ENCOUNTER — Encounter: Payer: Self-pay | Admitting: Obstetrics and Gynecology

## 2019-01-28 ENCOUNTER — Ambulatory Visit: Payer: BC Managed Care – PPO | Admitting: Obstetrics and Gynecology

## 2019-01-28 VITALS — BP 126/88 | HR 88 | Temp 98.1°F | Ht 69.0 in | Wt 136.0 lb

## 2019-01-28 DIAGNOSIS — N939 Abnormal uterine and vaginal bleeding, unspecified: Secondary | ICD-10-CM | POA: Diagnosis not present

## 2019-01-28 LAB — POCT URINE PREGNANCY: Preg Test, Ur: NEGATIVE

## 2019-01-28 MED ORDER — NORETHIN ACE-ETH ESTRAD-FE 1-20 MG-MCG PO TABS: 1.0000 | ORAL_TABLET | Freq: Every day | ORAL | 1 refills | Status: DC

## 2019-01-28 MED ORDER — MEDROXYPROGESTERONE ACETATE 10 MG PO TABS: ORAL_TABLET | ORAL | 0 refills | Status: DC

## 2019-01-28 NOTE — Patient Instructions (Addendum)
Oral Contraception Information Oral contraceptive pills (OCPs) are medicines taken to prevent pregnancy. OCPs are taken by mouth, and they work by:  Preventing the ovaries from releasing eggs.  Thickening mucus in the lower part of the uterus (cervix), which prevents sperm from entering the uterus.  Thinning the lining of the uterus (endometrium), which prevents a fertilized egg from attaching to the endometrium. OCPs are highly effective when taken exactly as prescribed. However, OCPs do not prevent STIs (sexually transmitted infections). Safe sex practices, such as using condoms while on an OCP, can help prevent STIs. Before starting OCPs Before you start taking OCPs, you may have a physical exam, blood test, and Pap test. However, you are not required to have a pelvic exam in order to be prescribed OCPs. Your health care provider will make sure you are a good candidate for oral contraception. OCPs are not a good option for certain women, including women who smoke and are older than 35 years, and women with a medical history of high blood pressure, deep vein thrombosis, pulmonary embolism, stroke, cardiovascular disease, or peripheral vascular disease. Discuss with your health care provider the possible side effects of the OCP you may be prescribed. When you start an OCP, be aware that it can take 2-3 months for your body to adjust to changes in hormone levels. Follow instructions from your health care provider about how to start taking your first cycle of OCPs. Depending on when you start the pill, you may need to use a backup form of birth control, such as condoms, during the first week. Make sure you know what steps to take if you ever forget to take the pill. Types of oral contraception  The most common types of birth control pills contain the hormones estrogen and progestin (synthetic progesterone) or progestin only. The combination pill This type of pill contains estrogen and progestin  hormones. Combination pills often come in packs of 21, 28, or 91 pills. For each pack, the last 7 pills may not contain hormones, which means you may stop taking the pills for 7 days. Menstrual bleeding occurs during the week that you do not take the pills or that you take the pills with no hormones in them. The minipill This type of pill contains the progestin hormone only. It comes in packs of 28 pills. All 28 pills contain the hormone. You take the pill every day. It is very important to take the pill at the same time each day. Advantages of oral contraceptive pills  Provides reliable and continuous contraception if taken as instructed.  May treat or decrease symptoms of: ? Menstrual period cramps. ? Irregular menstrual cycle or bleeding. ? Heavy menstrual flow. ? Abnormal uterine bleeding. ? Acne, depending on the type of pill. ? Polycystic ovarian syndrome. ? Endometriosis. ? Iron deficiency anemia. ? Premenstrual symptoms, including premenstrual dysphoric disorder.  May reduce the risk of endometrial and ovarian cancer.  Can be used as emergency contraception.  Prevents mislocated (ectopic) pregnancies and infections of the fallopian tubes. Things that can make oral contraceptive pills less effective OCPs can be less effective if:  You forget to take the pill at the same time every day. This is especially important when taking the minipill.  You have a stomach or intestinal disease that reduces your body's ability to absorb the pill.  You take OCPs with other medicines that make OCPs less effective, such as antibiotics, certain HIV medicines, and some seizure medicines.  You take expired OCPs.  You forget to restart the pill on day 7, if using the packs of 21 pills. Risks associated with oral contraceptive pills Oral contraceptive pills can sometimes cause side effects, such as:  Headache.  Depression.  Trouble sleeping.  Nausea and vomiting.  Breast  tenderness.  Irregular bleeding or spotting during the first several months.  Bloating or fluid retention.  Increase in blood pressure. Combination pills are also associated with a small increase in the risk of:  Blood clots.  Heart attack.  Stroke. Summary  Oral contraceptive pills are medicines taken by mouth to prevent pregnancy. They are highly effective when taken exactly as prescribed.  The most common types of birth control pills contain the hormones estrogen and progestin (synthetic progesterone) or progestin only.  Before you start taking the pill, you may have a physical exam, blood test, and Pap test. Your health care provider will make sure you are a good candidate for oral contraception.  The combination pill may come in a 21-day pack, a 28-day pack, or a 91-day pack. The minipill contains the progesterone hormone only and comes in packs of 28 pills.  Oral contraceptive pills can sometimes cause side effects, such as headache, nausea, breast tenderness, or irregular bleeding. This information is not intended to replace advice given to you by your health care provider. Make sure you discuss any questions you have with your health care provider. Document Released: 12/28/2002 Document Revised: 12/31/2016 Document Reviewed: 12/31/2016 Elsevier Interactive Patient Education  2019 Elsevier Inc.  Endometrial Biopsy Post-procedure Instructions  Cramping is common.  You may take Ibuprofen, Aleve, or Tylenol for the cramping.  This should resolve within 24 hours.    You may have a small amount of spotting.  You should wear a mini pad for the next few days.  You may have intercourse in 24 hours.  You need to call the office if you have any pelvic pain, fever, heavy bleeding, or foul smelling vaginal discharge.  Shower or bathe as normal  You will be notified within one week of your biopsy results or we will discuss your results at your follow-up appointment if needed.

## 2019-01-28 NOTE — Addendum Note (Signed)
Addended by: Polly Cobia on: 01/28/2019 02:40 PM   Modules accepted: Orders

## 2019-01-28 NOTE — Progress Notes (Signed)
GYNECOLOGY  VISIT   HPI: 53 y.o.   Divorced White or Caucasian Not Hispanic or Latino  female   [id number] with Patient's last menstrual period was 01/08/2019 (exact date).   here for irregular vaginal bleeding.  She went off of OCP's in 11/19 after [facility] returned at 36, AMH <0.015. She had a full cycle around Christmas, bleed x 4 days, normal flow. Then had spotting in 2/20 for a few days. She then started bleeding on 01/08/19 and is still bleeding. Heavier today than it has been, going through a regular tampon in 4 hours, today in less than 2 hours.  No vasomotor symptoms. No vaginal dryness.  She is sexually active.  GYNECOLOGIC HISTORY: Patient's last menstrual period was 01/08/2019 (exact date). Contraception: PMP Menopausal hormone therapy: none        OB History    Gravida  1   Para  1   Term  1   Preterm      AB      Living  1     SAB      TAB      Ectopic      Multiple      Live Births  1              Patient Active Problem List   Diagnosis Date Noted  . Primary hypothyroidism 01/16/2017  . Overactive bladder 10/06/2012  . ADD (attention deficit disorder) 02/17/2012  . Major depressive disorder 11/17/2006  . DYSPLASIA, CERVIX, MILD 11/07/2006    Past Medical History:  Diagnosis Date  . Anemia   . Depression   . Fibroid   . Mood disorder ([place])   . Thyroid disease     Past Surgical History:  Procedure Laterality Date  . APPENDECTOMY      Current Outpatient Medications  Medication Sig Dispense Refill  . amphetamine-dextroamphetamine (ADDERALL) 20 MG tablet Take 1 tablet (20 mg total) by mouth 2 (two) times daily. Two tablets twice a day 60 tablet 0  . buPROPion (WELLBUTRIN XL) 150 MG 24 hr tablet Take 150 mg by mouth daily.    . Cranberry 1000 MG CAPS Take by mouth.    . divalproex (DEPAKOTE ER) 500 MG 24 hr tablet TAKE 2 TABLETS DAILY 180 tablet 4  . fesoterodine (TOVIAZ) 8 MG TB24 tablet Take 1 tablet (8 mg total) by mouth daily. 90 tablet 4   . ketoconazole (NIZORAL) 2 % cream APP EXT AA D  0  . Lactobacillus (ACIDOPHILUS PO) Take by mouth daily.    Marland Kitchen levothyroxine (SYNTHROID, LEVOTHROID) 88 MCG tablet Take 88 mcg by mouth daily before breakfast.    . Multiple Vitamin (MULTIVITAMIN) tablet Take 1 tablet by mouth daily.    . sertraline (ZOLOFT) 50 MG tablet Take 1 tablet by mouth daily.    Marland Kitchen triamcinolone cream (KENALOG) 0.1 % APP EXT AA HS  0   No current facility-administered medications for this visit.      ALLERGIES: Myrbetriq [mirabegron]  Family History  Problem Relation Age of Onset  . Heart disease Father   . Hyperlipidemia Mother   . Mental retardation Mother   . Heart disease Maternal Grandmother   . Cancer Maternal Grandfather        pancreatice  . Alzheimer's disease Paternal Grandmother   . Cancer Paternal Grandfather        colon    Social History   Socioeconomic History  . Marital status: Divorced    Spouse name: Not on  file  . Number of children: Not on file  . Years of education: Not on file  . Highest education level: Not on file  Occupational History  . Occupation: professor    Employer: [employer]  . Financial resource strain: Not on file  . Food insecurity:    Worry: Not on file    Inability: Not on file  . Transportation needs:    Medical: Not on file    Non-medical: Not on file  Tobacco Use  . Smoking status: Never Smoker  . Smokeless tobacco: Never Used  Substance and Sexual Activity  . Alcohol use: Yes    Comment: social - 1-2 drinks per month  . Drug use: No  . Sexual activity: Yes    Partners: Male    Birth control/protection: Pill  Lifestyle  . Physical activity:    Days per week: Not on file    Minutes per session: Not on file  . Stress: Not on file  Relationships  . Social connections:    Talks on phone: Not on file    Gets together: Not on file    Attends religious service: Not on file    Active member of club or organization: Not on file     Attends meetings of clubs or organizations: Not on file    Relationship status: Not on file  . Intimate partner violence:    Fear of current or ex partner: Not on file    Emotionally abused: Not on file    Physically abused: Not on file    Forced sexual activity: Not on file  Other Topics Concern  . 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   HENT: Negative.   Eyes: Negative.   Respiratory: Negative.   Cardiovascular: Negative.   Gastrointestinal: Negative.   Genitourinary:       Menstrual cycle changes  Unscheduled bleeding   Musculoskeletal: Negative.   Skin: Negative.   Neurological: Negative.   Endo/Heme/Allergies: Negative.   Psychiatric/Behavioral: Negative.   All other systems reviewed and are negative.   PHYSICAL EXAMINATION:    BP 126/88   Pulse 88   Temp 98.1 F (36.7 C) (Oral)   Ht 5\' 9"  (1.753 m)   Wt 136 lb (61.7 kg)   LMP 01/08/2019 (Exact Date)   BMI 20.08 kg/m     General appearance: alert, cooperative and appears stated age Neck: no adenopathy, supple, symmetrical, trachea midline and thyroid normal to inspection and palpation Abdomen: soft, non-tender; non distended, no masses,  no organomegaly  Pelvic: External genitalia:  no lesions              Urethra:  normal appearing urethra with no masses, tenderness or lesions              Bartholins and Skenes: normal                 Vagina: normal appearing vagina with normal color and discharge, no lesions              Cervix: no lesions              Bimanual Exam:  Uterus:  normal size, contour, position, consistency, mobility,  non-tender and retroverted              Adnexa: no mass, fullness, tenderness                The risks of endometrial biopsy were reviewed and a consent was obtained.  A  speculum was placed in the vagina and the cervix was cleansed with betadine. A tenaculum was placed on the cervix and the uterine evacuator was placed through the cervix to a max depth of 6 cm, curettage was done. A moderate amount of blood/tissue was obtained. Attempted to dilate the cervix, unable to get past 6 cm with mini-dilator or the  mini-pipelle. The tenaculum and speculum were removed. There were no complications.   Chaperone was present for exam.  ASSESSMENT AUB after stopping OCP's, sound c/w an anovulatory bleed    PLAN UPT Endometrial biopsy done, not sure I was completely in the cavity CBC, Ferritin Provera 10 mg bid until bleeding stops, then 1 tablet a day x 10 days Discussed cyclic provera and OCP's, she wants to restart OCP's, understands the risks. No contraindication.    An After Visit Summary was printed and given to the patient.

## 2019-01-29 ENCOUNTER — Telehealth: Payer: Self-pay | Admitting: Emergency Medicine

## 2019-01-29 LAB — FERRITIN: Ferritin: 17 ng/mL (ref 15–150)

## 2019-01-29 LAB — CBC
Hematocrit: 34.1 % (ref 34.0–46.6)
Hemoglobin: 11 g/dL — ABNORMAL LOW (ref 11.1–15.9)
MCH: 29.7 pg (ref 26.6–33.0)
MCHC: 32.3 g/dL (ref 31.5–35.7)
MCV: 92 fL (ref 79–97)
Platelets: 263 10*3/uL (ref 150–450)
RBC: 3.7 x10E6/uL — ABNORMAL LOW (ref 3.77–5.28)
RDW: 12.6 % (ref 11.7–15.4)
WBC: 7.4 10*3/uL (ref 3.4–10.8)

## 2019-01-29 NOTE — Telephone Encounter (Signed)
-----   Message from Salvadore Dom, MD sent at 01/29/2019  1:41 PM EDT ----- Please let the patient know that she is mildly anemic, she should start on one iron tablet a day (ie ferrex 150 mg) for the next month. Please see if her bleeding has slowed down on the provera.

## 2019-01-29 NOTE — Telephone Encounter (Signed)
Spoke with patient.  She states bleeding "is about the same". Started Provera last night. Will continue as directed.  Feels well, denies complaints.  Advised patient to call back or seek immediate medical care if bleeding worsens or soaking through 1 pad/tampon per hour for two hours or if becomes symptomatic with sob, chest pain, fatigued, lightheaded, or weakness.  Patient verbalized understanding. Lab results from Dr. Talbert Nan discussed. Pt will start Iron tablet.  Encounter to Dr. Talbert Nan and will close.

## 2019-02-04 ENCOUNTER — Encounter: Payer: Self-pay | Admitting: Obstetrics and Gynecology

## 2019-02-05 ENCOUNTER — Telehealth: Payer: Self-pay | Admitting: Obstetrics and Gynecology

## 2019-02-05 NOTE — Telephone Encounter (Signed)
Patient sent the following correspondence through [facility]. Routing to triage to assist patient with request.  And now I have BV. I am all too familiar with the odor so I know what it is. Can you call in the oral medication for this? The cream causes me to get a yeast infection. Thanks. Kerri Murphy

## 2019-02-22 ENCOUNTER — Encounter: Payer: Self-pay | Admitting: Obstetrics and Gynecology

## 2019-02-23 ENCOUNTER — Telehealth: Payer: Self-pay | Admitting: Obstetrics and Gynecology

## 2019-02-23 NOTE — Telephone Encounter (Signed)
Patient sent the following correspondence through [facility]. Routing to triage to assist patient with request.  Hello. It is me again regarding bleeding. I took the Medroxyprogestrerone as directed. I took 2 per day until all bleeding stopped, and then 1 per day for 10 days. I then had 3 days of no bleeding. I started spotting last Monday (April 27). I began the birth control pills that day. I continued to spot throughout the week. The spotting became heavier on Sunday and has gotten heavier today with increased cramping. Thoughts? Thanks. Kerri Murphy

## 2019-02-23 NOTE — Telephone Encounter (Signed)
Spoke with patient. Started OCP on 02/15/19 following provera, bleeding has decreased to spotting, changes non saturated pad q3 hrs. Denies any cramping today, has been experiencing mild menses like cramps. Denies lightheadedness, weakness, SOB, fatigue. Patient states she was expecting bleeding to have stopped when starting OCP. Advised patient can take 3 months for cycles to adjust to new OCP. Recommended monitor and calendar bleeding, if bleeding becomes heavy or changing pad q1-2h or any new symptoms develop, return call to office. Reviewed importance of taking OCP daily, roughly same time. Dr. Talbert Nan will review, I will return call if any additional recommendations.   Routing to provider for final review. Patient is agreeable to disposition. Will close encounter.

## 2019-03-05 ENCOUNTER — Other Ambulatory Visit: Payer: Self-pay | Admitting: *Deleted

## 2019-03-05 NOTE — Telephone Encounter (Signed)
Medication refill request: Provera  Last AEX:  08/04/18 JJ Next AEX: none Last MMG (if hormonal medication request): 09/28/18 BIRADS3:Probably benign. Has appt 04/01/19 Refill authorized: 01/28/19 #[id number]. Today please advise.

## 2019-03-07 NOTE — Telephone Encounter (Signed)
Refill for provera is not appropriate. She has started [employer]

## 2019-05-13 ENCOUNTER — Other Ambulatory Visit: Payer: Self-pay

## 2019-05-13 DIAGNOSIS — N3281 Overactive bladder: Secondary | ICD-10-CM

## 2019-05-21 ENCOUNTER — Other Ambulatory Visit: Payer: Self-pay

## 2019-05-25 ENCOUNTER — Ambulatory Visit: Payer: BC Managed Care – PPO | Admitting: Obstetrics and Gynecology

## 2019-05-25 ENCOUNTER — Encounter: Payer: Self-pay | Admitting: Obstetrics and Gynecology

## 2019-05-25 ENCOUNTER — Other Ambulatory Visit: Payer: Self-pay

## 2019-05-25 VITALS — BP 120/68 | HR 72 | Temp 97.9°F | Wt 137.0 lb

## 2019-05-25 DIAGNOSIS — N760 Acute vaginitis: Secondary | ICD-10-CM

## 2019-05-25 DIAGNOSIS — N898 Other specified noninflammatory disorders of vagina: Secondary | ICD-10-CM | POA: Diagnosis not present

## 2019-05-25 DIAGNOSIS — B373 Candidiasis of vulva and vagina: Secondary | ICD-10-CM | POA: Diagnosis not present

## 2019-05-25 DIAGNOSIS — B3731 Acute candidiasis of vulva and vagina: Secondary | ICD-10-CM

## 2019-05-25 MED ORDER — BETAMETHASONE VALERATE 0.1 % EX OINT: 1.0000 "application " | TOPICAL_OINTMENT | Freq: Two times a day (BID) | CUTANEOUS | 0 refills | Status: DC

## 2019-05-25 MED ORDER — FLUCONAZOLE 150 MG PO TABS: 150.0000 mg | ORAL_TABLET | Freq: Once | ORAL | 0 refills | Status: AC

## 2019-05-25 NOTE — Progress Notes (Signed)
GYNECOLOGY  VISIT   HPI: 53 y.o.   Divorced White or Caucasian Not Hispanic or Latino  female   [id number] with Patient's last menstrual period was 05/02/2019 (approximate).   here for BV symptoms. Her symptoms started ~3 months ago, much worse in the last week. There was an odor last week, little better currently. Increase in white, creamy vaginal discharge. She does c/o itching and irritation as well.   GYNECOLOGIC HISTORY: Patient's last menstrual period was 05/02/2019 (approximate). Contraception:OCP Menopausal hormone therapy: None        OB History    Gravida  1   Para  1   Term  1   Preterm      AB      Living  1     SAB      TAB      Ectopic      Multiple      Live Births  1              Patient Active Problem List   Diagnosis Date Noted  . Primary hypothyroidism 01/16/2017  . Overactive bladder 10/06/2012  . ADD (attention deficit disorder) 02/17/2012  . Major depressive disorder 11/17/2006  . DYSPLASIA, CERVIX, MILD 11/07/2006    Past Medical History:  Diagnosis Date  . Anemia   . Depression   . Fibroid   . Mood disorder ([place])   . Thyroid disease     Past Surgical History:  Procedure Laterality Date  . APPENDECTOMY      Current Outpatient Medications  Medication Sig Dispense Refill  . amphetamine-dextroamphetamine (ADDERALL) 20 MG tablet Take 1 tablet (20 mg total) by mouth 2 (two) times daily. Two tablets twice a day 60 tablet 0  . buPROPion (WELLBUTRIN XL) 150 MG 24 hr tablet Take 150 mg by mouth daily.    . Cranberry 1000 MG CAPS Take by mouth.    . divalproex (DEPAKOTE ER) 500 MG 24 hr tablet TAKE 2 TABLETS DAILY 180 tablet 4  . fesoterodine (TOVIAZ) 8 MG TB24 tablet Take 1 tablet (8 mg total) by mouth daily. 90 tablet 4  . ketoconazole (NIZORAL) 2 % cream APP EXT AA D  0  . Lactobacillus (ACIDOPHILUS PO) Take by mouth daily.    Marland Kitchen levothyroxine (SYNTHROID, LEVOTHROID) 88 MCG tablet Take 88 mcg by mouth daily before breakfast.    .  Multiple Vitamin (MULTIVITAMIN) tablet Take 1 tablet by mouth daily.    . norethindrone-ethinyl estradiol (JUNEL FE,GILDESS FE,LOESTRIN FE) 1-20 MG-MCG tablet Take 1 tablet by mouth daily. 3 Package 1  . sertraline (ZOLOFT) 50 MG tablet Take 1 tablet by mouth daily.    Marland Kitchen triamcinolone cream (KENALOG) 0.1 % APP EXT AA HS  0   No current facility-administered medications for this visit.      ALLERGIES: Myrbetriq [mirabegron]  Family History  Problem Relation Age of Onset  . Heart disease Father   . Hyperlipidemia Mother   . Mental retardation Mother   . Heart disease Maternal Grandmother   . Cancer Maternal Grandfather        pancreatice  . Alzheimer's disease Paternal Grandmother   . Cancer Paternal Grandfather        colon    Social History   Socioeconomic History  . Marital status: Divorced    Spouse name: Not on file  . Number of children: Not on file  . Years of education: Not on file  . Highest education level: Not on file  Occupational  History  . Occupation: professor    Employer: [employer]  . Financial resource strain: Not on file  . Food insecurity    Worry: Not on file    Inability: Not on file  . Transportation needs    Medical: Not on file    Non-medical: Not on file  Tobacco Use  . Smoking status: Never Smoker  . Smokeless tobacco: Never Used  Substance and Sexual Activity  . Alcohol use: Yes    Comment: social - 1-2 drinks per month  . Drug use: No  . Sexual activity: Yes    Partners: Male    Birth control/protection: Pill  Lifestyle  . Physical activity    Days per week: Not on file    Minutes per session: Not on file  . Stress: Not on file  Relationships  . Social Herbalist on phone: Not on file    Gets together: Not on file    Attends religious service: Not on file    Active member of club or organization: Not on file    Attends meetings of clubs or organizations: Not on file    Relationship status: Not on  file  . Intimate partner violence    Fear of current or ex partner: Not on file    Emotionally abused: Not on file    Physically abused: Not on file    Forced sexual activity: Not on file  Other Topics Concern  . 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   HENT: Negative.   Eyes: Negative.   Respiratory: Negative.   Cardiovascular: Negative.   Gastrointestinal: Negative.   Genitourinary:       Odor Vaginal discharge  Musculoskeletal: Negative.   Skin: Negative.   Neurological: Negative.   Endo/Heme/Allergies: Negative.   Psychiatric/Behavioral: Negative.     PHYSICAL EXAMINATION:    BP 120/68 (BP Location: Right Arm, Patient Position: Sitting, Cuff Size: Normal)   Pulse 72   Temp 97.9 F (36.6 C) (Skin)   Wt 137 lb (62.1 kg)   LMP 05/02/2019 (Approximate)   BMI 20.23 kg/m     General appearance: alert, cooperative and appears stated age  Pelvic: External genitalia:  no lesions, + erythema              Urethra:  normal appearing urethra with no masses, tenderness or lesions              Bartholins and Skenes: normal                 Vagina: normal appearing vagina with an increase in thick white vaginal d/c, slight spotting              Cervix: no lesions                Chaperone was present for exam.  Wet prep: ? clue, no trich, + wbc KOH: + yeast PH: 4.5   ASSESSMENT Yeast vaginitis, possible BV    PLAN Treat with steroid ointment and diflucan  Send affirm to r/o BV   An After Visit Summary was printed and given to the patient.  ~15 minutes face to face time of which  over 50% was spent in counseling.

## 2019-05-25 NOTE — Patient Instructions (Signed)
Vaginitis Vaginitis is a condition in which the vaginal tissue swells and becomes red (inflamed). This condition is most often caused by a change in the normal balance of bacteria and yeast that live in the vagina. This change causes an overgrowth of certain bacteria or yeast, which causes the inflammation. There are different types of vaginitis, but the most common types are:  Bacterial vaginosis.  Yeast infection (candidiasis).  Trichomoniasis vaginitis. This is a sexually transmitted disease (STD).  Viral vaginitis.  Atrophic vaginitis.  Allergic vaginitis. What are the causes? The cause of this condition depends on the type of vaginitis. It can be caused by:  Bacteria (bacterial vaginosis).  Yeast, which is a fungus (yeast infection).  A parasite (trichomoniasis vaginitis).  A virus (viral vaginitis).  Low hormone levels (atrophic vaginitis). Low hormone levels can occur during pregnancy, breastfeeding, or after menopause.  Irritants, such as bubble baths, scented tampons, and feminine sprays (allergic vaginitis). Other factors can change the normal balance of the yeast and bacteria that live in the vagina. These include:  Antibiotic medicines.  Poor hygiene.  Diaphragms, vaginal sponges, spermicides, birth control pills, and intrauterine devices (IUD).  Sex.  Infection.  Uncontrolled diabetes.  A weakened defense (immune) system. What increases the risk? This condition is more likely to develop in women who:  Smoke.  Use vaginal douches, scented tampons, or scented sanitary pads.  Wear tight-fitting pants.  Wear thong underwear.  Use oral birth control pills or an IUD.  Have sex without a condom.  Have multiple sex partners.  Have an STD.  Frequently use the spermicide nonoxynol-9.  Eat lots of foods high in sugar.  Have uncontrolled diabetes.  Have low estrogen levels.  Have a weakened immune system from an immune disorder or medical  treatment.  Are pregnant or breastfeeding. What are the signs or symptoms? Symptoms vary depending on the cause of the vaginitis. Common symptoms include:  Abnormal vaginal discharge. ? The discharge is white, gray, or yellow with bacterial vaginosis. ? The discharge is thick, white, and cheesy with a yeast infection. ? The discharge is frothy and yellow or greenish with trichomoniasis.  A bad vaginal smell. The smell is fishy with bacterial vaginosis.  Vaginal itching, pain, or swelling.  Sex that is painful.  Pain or burning when urinating. Sometimes there are no symptoms. How is this diagnosed? This condition is diagnosed based on your symptoms and medical history. A physical exam, including a pelvic exam, will also be done. You may also have other tests, including:  Tests to determine the pH level (acidity or alkalinity) of your vagina.  A whiff test, to assess the odor that results when a sample of your vaginal discharge is mixed with a potassium hydroxide solution.  Tests of vaginal fluid. A sample will be examined under a microscope. How is this treated? Treatment varies depending on the type of vaginitis you have. Your treatment may include:  Antibiotic creams or pills to treat bacterial vaginosis and trichomoniasis.  Antifungal medicines, such as vaginal creams or suppositories, to treat a yeast infection.  Medicine to ease discomfort if you have viral vaginitis. Your sexual partner should also be treated.  Estrogen delivered in a cream, pill, suppository, or vaginal ring to treat atrophic vaginitis. If vaginal dryness occurs, lubricants and moisturizing creams may help. You may need to avoid scented soaps, sprays, or douches.  Stopping use of a product that is causing allergic vaginitis. Then using a vaginal cream to treat the symptoms. Follow   these instructions at home: Lifestyle  Keep your genital area clean and dry. Avoid soap, and only rinse the area with  water.  Do not douche or use tampons until your health care provider says it is okay to do so. Use sanitary pads, if needed.  Do not have sex until your health care provider approves. When you can return to sex, practice safe sex and use condoms.  Wipe from front to back. This avoids the spread of bacteria from the rectum to the vagina. General instructions  Take over-the-counter and prescription medicines only as told by your health care provider.  If you were prescribed an antibiotic medicine, take or use it as told by your health care provider. Do not stop taking or using the antibiotic even if you start to feel better.  Keep all follow-up visits as told by your health care provider. This is important. How is this prevented?  Use mild, non-scented products. Do not use things that can irritate the vagina, such as fabric softeners. Avoid the following products if they are scented: ? Feminine sprays. ? Detergents. ? Tampons. ? Feminine hygiene products. ? Soaps or bubble baths.  Let air reach your genital area. ? Wear cotton underwear to reduce moisture buildup. ? Avoid wearing underwear while you sleep. ? Avoid wearing tight pants and underwear or nylons without a cotton panel. ? Avoid wearing thong underwear.  Take off any wet clothing, such as bathing suits, as soon as possible.  Practice safe sex and use condoms. Contact a health care provider if:  You have abdominal pain.  You have a fever.  You have symptoms that last for more than 2-3 days. Get help right away if:  You have a fever and your symptoms suddenly get worse. Summary  Vaginitis is a condition in which the vaginal tissue becomes inflamed.This condition is most often caused by a change in the normal balance of bacteria and yeast that live in the vagina.  Treatment varies depending on the type of vaginitis you have.  Do not douche, use tampons , or have sex until your health care provider approves. When  you can return to sex, practice safe sex and use condoms. This information is not intended to replace advice given to you by your health care provider. Make sure you discuss any questions you have with your health care provider. Document Released: 08/04/2007 Document Revised: 09/19/2017 Document Reviewed: 11/12/2016 Elsevier Patient Education  2020 Elsevier Inc.  

## 2019-05-26 LAB — VAGINITIS/VAGINOSIS, DNA PROBE
Candida Species: POSITIVE — AB
Gardnerella vaginalis: NEGATIVE
Trichomonas vaginosis: NEGATIVE

## 2019-07-09 ENCOUNTER — Other Ambulatory Visit: Payer: Self-pay

## 2019-07-09 MED ORDER — NORETHIN ACE-ETH ESTRAD-FE 1-20 MG-MCG PO TABS: 1.0000 | ORAL_TABLET | Freq: Every day | ORAL | 0 refills | Status: DC

## 2019-07-09 NOTE — Telephone Encounter (Signed)
Medication refill request: junel fe 1/20 Last AEX:  08-04-18 Next AEX: not scheduled. Left message for patient to call to schedule aex. Also note placed in rx for patient to call for further refills & update mmg. Last MMG (if hormonal medication request): 11/19 bilateral & diagnostic 12/19 category c density birads 3: prob benign, f/u 87mths recommended (not done) Refill authorized: please approve if appropriate

## 2019-08-17 ENCOUNTER — Other Ambulatory Visit: Payer: Self-pay | Admitting: Obstetrics and Gynecology

## 2019-08-17 MED ORDER — NORETHIN ACE-ETH ESTRAD-FE 1-20 MG-MCG PO TABS: 1.0000 | ORAL_TABLET | Freq: Every day | ORAL | 0 refills | Status: DC

## 2019-08-17 NOTE — Telephone Encounter (Signed)
Please let the patient know that a 3 month supply of her OCP's have been sent. Please schedule her for an annual exam and remind her that she is overdue for a f/u mammogram.

## 2019-08-17 NOTE — Telephone Encounter (Signed)
Routing to Dr. Jertson 

## 2019-08-18 NOTE — Telephone Encounter (Signed)
Spoke with patient. Aex scheduled for 09/30/2019 at 10:30 am with Dr.Jertson. Patient declines earlier appointment. Patient is aware that she is due for her follow up mammogram and will schedule. Encounter closed.

## 2019-09-09 ENCOUNTER — Other Ambulatory Visit: Payer: Self-pay | Admitting: Obstetrics and Gynecology

## 2019-09-15 ENCOUNTER — Telehealth: Payer: Self-pay

## 2019-09-15 NOTE — Telephone Encounter (Signed)
Patient is in 04 recall for follow up right breast imaging. Please contact patient to schedule.

## 2019-09-20 NOTE — Telephone Encounter (Signed)
Left message to call Kaitlyn at [phone]. 

## 2019-09-20 NOTE — Telephone Encounter (Signed)
Spoke with patient. Advised of need to schedule follow up breast imaging. Patient states that she has been busy with work and has been unable to schedule. Offered to assist patient with scheduling. Patient declines. Patient will return call if she needs assistance with scheduling.  Dr.Jertson, please advise next steps with recall.

## 2019-09-20 NOTE — Telephone Encounter (Signed)
She will need to get a mammogram and have an annual, prior to refilling OCP's.  She should really try to go off OCP's again. Last year her [place] was 51 and she had irregular bleeding after going off of OCP's. When she goes off of OCP's then I would recommend cyclic provera.

## 2019-09-21 NOTE — Telephone Encounter (Signed)
Spoke with patient. Message given as seen below from [place]. Patient verbalizes understanding. Patient has annual scheduled for 09/30/2019 with Dr.Jertson. Encounter closed.

## 2019-09-28 ENCOUNTER — Other Ambulatory Visit: Payer: Self-pay

## 2019-09-28 NOTE — Progress Notes (Signed)
53 y.o. [id number] Divorced White or Caucasian Not Hispanic or Latino female here for annual exam.   She went off of OCP's in 11/19 after her [facility] returned at 77. She then had AUB. Endometrial biopsy returned with secretory endometrium. She was restarted on OCP's. Sexually active, same partner. No dyspareunia.   Period Cycle (Days): 28 Period Duration (Days): 5 days Period Pattern: Regular Menstrual Flow: Moderate, Light Menstrual Control: Tampon, Thin pad Menstrual Control Change Freq (Hours): changes tampon/pad every 3 hours Dysmenorrhea: (!) Moderate Dysmenorrhea Symptoms: Cramping  Patient's last menstrual period was 09/22/2019 (exact date).          Sexually active: Yes.    The current method of family planning is OCP (estrogen/progesterone).    Exercising: Yes.    cycling Smoker:  no  Health Maintenance: Pap: 08/04/2018 WNL NEG HPV, 07/03/2017 negative pap with positive HPV and negative 16/18/45 testing History of abnormal Pap:Yes MMG:09/28/2018 BI-RADS CATEGORY  3: Probably benign. Colonoscopy:Never, had Cologuard Cologuard: 04-03-17 Normal  [id number] Gardasil:N/A   reports that she has never smoked. She has never used smokeless tobacco. She reports current alcohol use. She reports that she does not use drugs.  Past Medical History:  Diagnosis Date  . Anemia   . Depression   . Fibroid   . Mood disorder ([place])   . Thyroid disease     Past Surgical History:  Procedure Laterality Date  . APPENDECTOMY      Current Outpatient Medications  Medication Sig Dispense Refill  . amphetamine-dextroamphetamine (ADDERALL) 20 MG tablet Take 1 tablet (20 mg total) by mouth 2 (two) times daily. Two tablets twice a day 60 tablet 0  . buPROPion (WELLBUTRIN XL) 150 MG 24 hr tablet Take 150 mg by mouth daily.    . Cranberry 1000 MG CAPS Take by mouth.    . divalproex (DEPAKOTE ER) 500 MG 24 hr tablet TAKE 2 TABLETS DAILY 180 tablet 4  . fesoterodine (TOVIAZ) 8 MG  TB24 tablet Take 1 tablet (8 mg total) by mouth daily. 90 tablet 4  . ketoconazole (NIZORAL) 2 % cream APP EXT AA D  0  . Lactobacillus (ACIDOPHILUS PO) Take by mouth daily.    Marland Kitchen levothyroxine (SYNTHROID, LEVOTHROID) 88 MCG tablet Take 88 mcg by mouth daily before breakfast.    . Multiple Vitamin (MULTIVITAMIN) tablet Take 1 tablet by mouth daily.    . norethindrone-ethinyl estradiol (LOESTRIN FE) 1-20 MG-MCG tablet Take 1 tablet by mouth daily. 3 Package 0  . sertraline (ZOLOFT) 50 MG tablet Take 1 tablet by mouth daily.    Marland Kitchen triamcinolone cream (KENALOG) 0.1 % APP EXT AA HS  0   No current facility-administered medications for this visit.    Family History  Problem Relation Age of Onset  . Heart disease Father   . Hyperlipidemia Mother   . Mental retardation Mother   . Heart disease Maternal Grandmother   . Cancer Maternal Grandfather        pancreatice  . Alzheimer's disease Paternal Grandmother   . Cancer Paternal Grandfather        colon    Review of Systems  Constitutional: Negative.   HENT: Negative.   Eyes: Negative.   Respiratory: Negative.   Cardiovascular: Negative.   Gastrointestinal: Negative.   Endocrine: Negative.   Genitourinary: Negative.   Musculoskeletal: Negative.   Skin: Negative.   Allergic/Immunologic: Negative.   Neurological: Negative.   Hematological: Negative.   Psychiatric/Behavioral: Negative.     Exam:   BP  118/82 (BP Location: Right Arm, Patient Position: Sitting, Cuff Size: Normal)   Pulse 84   Temp (!) 97.2 F (36.2 C) (Skin)   Ht 5\' 9"  (1.753 m)   Wt 138 lb 9.6 oz (62.9 kg)   LMP 09/22/2019 (Exact Date)   BMI 20.47 kg/m   Weight change: @WEIGHTCHANGE @ Height:   Height: 5\' 9"  (175.3 cm)  Ht Readings from Last 3 Encounters:  09/30/19 5\' 9"  (1.753 m)  01/28/19 5\' 9"  (1.753 m)  08/04/18 5\' 9"  (1.753 m)    General appearance: alert, cooperative and appears stated age Head: Normocephalic, without obvious abnormality,  atraumatic Neck: no adenopathy, supple, symmetrical, trachea midline and thyroid normal to inspection and palpation Lungs: clear to auscultation bilaterally Cardiovascular: regular rate and rhythm Breasts: normal appearance, no masses or tenderness Abdomen: soft, non-tender; non distended,  no masses,  no organomegaly Extremities: extremities normal, atraumatic, no cyanosis or edema Skin: Skin color, texture, turgor normal. No rashes or lesions Lymph nodes: Cervical, supraclavicular, and axillary nodes normal. No abnormal inguinal nodes palpated Neurologic: Grossly normal   Pelvic: External genitalia:  no lesions              Urethra:  normal appearing urethra with no masses, tenderness or lesions              Bartholins and Skenes: normal                 Vagina: normal appearing vagina with normal color and discharge, no lesions              Cervix: no lesions               Bimanual Exam:  Uterus:  normal size, contour, position, consistency, mobility, non-tender and retroverted              Adnexa: no mass, fullness, tenderness               Rectovaginal: Confirms               Anus:  normal sphincter tone, no lesions  Chaperone was present for exam.  A:  Well Woman with normal exam  Perimenopausal, she had AUB earlier this year when she went off of OCP's  H/O HPV in 2018  P:   Pap with hpv  Mammogram due, she will schedule  Due for cologuard in 6/21, she will check if covered  Continue OCP's for one more year, then will do a trial off  Discussed breast self exam  Discussed calcium and vit D intake  Screening labs

## 2019-09-30 ENCOUNTER — Other Ambulatory Visit: Payer: Self-pay

## 2019-09-30 ENCOUNTER — Encounter: Payer: Self-pay | Admitting: Obstetrics and Gynecology

## 2019-09-30 ENCOUNTER — Ambulatory Visit (INDEPENDENT_AMBULATORY_CARE_PROVIDER_SITE_OTHER): Payer: BC Managed Care – PPO | Admitting: Obstetrics and Gynecology

## 2019-09-30 ENCOUNTER — Other Ambulatory Visit (HOSPITAL_COMMUNITY)
Admission: RE | Admit: 2019-09-30 | Discharge: 2019-09-30 | Disposition: A | Discharge status: Home / Self Care | Payer: BC Managed Care – PPO | Source: Ambulatory Visit | Attending: Obstetrics and Gynecology | Admitting: Obstetrics and Gynecology

## 2019-09-30 VITALS — BP 118/82 | HR 84 | Temp 97.2°F | Ht 69.0 in | Wt 138.6 lb

## 2019-09-30 DIAGNOSIS — Z23 Encounter for immunization: Secondary | ICD-10-CM

## 2019-09-30 DIAGNOSIS — Z0000 Encounter for general adult medical examination without abnormal findings: Secondary | ICD-10-CM

## 2019-09-30 DIAGNOSIS — Z124 Encounter for screening for malignant neoplasm of cervix: Secondary | ICD-10-CM

## 2019-09-30 DIAGNOSIS — Z8619 Personal history of other infectious and parasitic diseases: Secondary | ICD-10-CM

## 2019-09-30 DIAGNOSIS — Z01419 Encounter for gynecological examination (general) (routine) without abnormal findings: Secondary | ICD-10-CM | POA: Diagnosis not present

## 2019-09-30 DIAGNOSIS — Z3041 Encounter for surveillance of contraceptive pills: Secondary | ICD-10-CM

## 2019-09-30 MED ORDER — NORETHIN ACE-ETH ESTRAD-FE 1-20 MG-MCG PO TABS: 1.0000 | ORAL_TABLET | Freq: Every day | ORAL | 3 refills | Status: AC

## 2019-09-30 NOTE — Patient Instructions (Signed)
EXERCISE AND DIET:  We recommended that you start or continue a regular exercise program for good health. Regular exercise means any activity that makes your heart beat faster and makes you sweat.  We recommend exercising at least 30 minutes per day at least 3 days a week, preferably 4 or 5.  We also recommend a diet low in fat and sugar.  Inactivity, poor dietary choices and obesity can cause diabetes, heart attack, stroke, and kidney damage, among others.    ALCOHOL AND SMOKING:  Women should limit their alcohol intake to no more than 7 drinks/beers/glasses of wine (combined, not each!) per week. Moderation of alcohol intake to this level decreases your risk of breast cancer and liver damage. And of course, no recreational drugs are part of a healthy lifestyle.  And absolutely no smoking or even second hand smoke. Most people know smoking can cause heart and lung diseases, but did you know it also contributes to weakening of your bones? Aging of your skin?  Yellowing of your teeth and nails?  CALCIUM AND VITAMIN D:  Adequate intake of calcium and Vitamin D are recommended.  The recommendations for exact amounts of these supplements seem to change often, but generally speaking 1,000 mg of calcium (between diet and supplement) and 800 units of Vitamin D per day seems prudent. Certain women may benefit from higher intake of Vitamin D.  If you are among these women, your doctor will have told you during your visit.    PAP SMEARS:  Pap smears, to check for cervical cancer or precancers,  have traditionally been done yearly, although recent scientific advances have shown that most women can have pap smears less often.  However, every woman still should have a physical exam from her gynecologist every year. It will include a breast check, inspection of the vulva and vagina to check for abnormal growths or skin changes, a visual exam of the cervix, and then an exam to evaluate the size and shape of the uterus and  ovaries.  And after 52 years of age, a rectal exam is indicated to check for rectal cancers. We will also provide age appropriate advice regarding health maintenance, like when you should have certain vaccines, screening for sexually transmitted diseases, bone density testing, colonoscopy, mammograms, etc.   MAMMOGRAMS:  All women over 40 years old should have a yearly mammogram. Many facilities now offer a "3D" mammogram, which may cost around $50 extra out of pocket. If possible,  we recommend you accept the option to have the 3D mammogram performed.  It both reduces the number of women who will be called back for extra views which then turn out to be normal, and it is better than the routine mammogram at detecting truly abnormal areas.    COLON CANCER SCREENING: Now recommend starting at age 45. At this time colonoscopy is not covered for routine screening until 50. There are take home tests that can be done between 45-49.   COLONOSCOPY:  Colonoscopy to screen for colon cancer is recommended for all women at age 50.  We know, you hate the idea of the prep.  We agree, BUT, having colon cancer and not knowing it is worse!!  Colon cancer so often starts as a polyp that can be seen and removed at colonscopy, which can quite literally save your life!  And if your first colonoscopy is normal and you have no family history of colon cancer, most women don't have to have it again for   10 years.  Once every ten years, you can do something that may end up saving your life, right?  We will be happy to help you get it scheduled when you are ready.  Be sure to check your insurance coverage so you understand how much it will cost.  It may be covered as a preventative service at no cost, but you should check your particular policy.      Breast Self-Awareness Breast self-awareness means being familiar with how your breasts look and feel. It involves checking your breasts regularly and reporting any changes to your  health care provider. Practicing breast self-awareness is important. A change in your breasts can be a sign of a serious medical problem. Being familiar with how your breasts look and feel allows you to find any problems early, when treatment is more likely to be successful. All women should practice breast self-awareness, including women who have had breast implants. How to do a breast self-exam One way to learn what is normal for your breasts and whether your breasts are changing is to do a breast self-exam. To do a breast self-exam: Look for Changes  1. Remove all the clothing above your waist. 2. Stand in front of a mirror in a room with good lighting. 3. Put your hands on your hips. 4. Push your hands firmly downward. 5. Compare your breasts in the mirror. Look for differences between them (asymmetry), such as: ? Differences in shape. ? Differences in size. ? Puckers, dips, and bumps in one breast and not the other. 6. Look at each breast for changes in your skin, such as: ? Redness. ? Scaly areas. 7. Look for changes in your nipples, such as: ? Discharge. ? Bleeding. ? Dimpling. ? Redness. ? A change in position. Feel for Changes Carefully feel your breasts for lumps and changes. It is best to do this while lying on your back on the floor and again while sitting or standing in the shower or tub with soapy water on your skin. Feel each breast in the following way:  Place the arm on the side of the breast you are examining above your head.  Feel your breast with the other hand.  Start in the nipple area and make  inch (2 cm) overlapping circles to feel your breast. Use the pads of your three middle fingers to do this. Apply light pressure, then medium pressure, then firm pressure. The light pressure will allow you to feel the tissue closest to the skin. The medium pressure will allow you to feel the tissue that is a little deeper. The firm pressure will allow you to feel the tissue  close to the ribs.  Continue the overlapping circles, moving downward over the breast until you feel your ribs below your breast.  Move one finger-width toward the center of the body. Continue to use the  inch (2 cm) overlapping circles to feel your breast as you move slowly up toward your collarbone.  Continue the up and down exam using all three pressures until you reach your armpit.  Write Down What You Find  Write down what is normal for each breast and any changes that you find. Keep a written record with breast changes or normal findings for each breast. By writing this information down, you do not need to depend only on memory for size, tenderness, or location. Write down where you are in your menstrual cycle, if you are still menstruating. If you are having trouble noticing differences   in your breasts, do not get discouraged. With time you will become more familiar with the variations in your breasts and more comfortable with the exam. How often should I examine my breasts? Examine your breasts every month. If you are breastfeeding, the best time to examine your breasts is after a feeding or after using a breast pump. If you menstruate, the best time to examine your breasts is 5-7 days after your period is over. During your period, your breasts are lumpier, and it may be more difficult to notice changes. When should I see my health care provider? See your health care provider if you notice:  A change in shape or size of your breasts or nipples.  A change in the skin of your breast or nipples, such as a reddened or scaly area.  Unusual discharge from your nipples.  A lump or thick area that was not there before.  Pain in your breasts.  Anything that concerns you.  

## 2019-10-01 LAB — COMPREHENSIVE METABOLIC PANEL
ALT: 12 IU/L (ref 0–32)
AST: 16 IU/L (ref 0–40)
Albumin/Globulin Ratio: 1.9 (ref 1.2–2.2)
Albumin: 4.3 g/dL (ref 3.8–4.9)
Alkaline Phosphatase: 49 IU/L (ref 39–117)
BUN/Creatinine Ratio: 15 (ref 9–23)
BUN: 14 mg/dL (ref 6–24)
Bilirubin Total: 0.3 mg/dL (ref 0.0–1.2)
CO2: 25 mmol/L (ref 20–29)
Calcium: 9.3 mg/dL (ref 8.7–10.2)
Chloride: 103 mmol/L (ref 96–106)
Creatinine, Ser: 0.95 mg/dL (ref 0.57–1.00)
GFR calc Af Amer: 80 mL/min/{1.73_m2} (ref >59)
GFR calc non Af Amer: 69 mL/min/{1.73_m2} (ref >59)
Globulin, Total: 2.3 g/dL (ref 1.5–4.5)
Glucose: 80 mg/dL (ref 65–99)
Potassium: 4.3 mmol/L (ref 3.5–5.2)
Sodium: 140 mmol/L (ref 134–144)
Total Protein: 6.6 g/dL (ref 6.0–8.5)

## 2019-10-01 LAB — LIPID PANEL
Chol/HDL Ratio: 2.5 ratio (ref 0.0–4.4)
Cholesterol, Total: 209 mg/dL — ABNORMAL HIGH (ref 100–199)
HDL: 84 mg/dL (ref >39)
LDL Chol Calc (NIH): 112 mg/dL — ABNORMAL HIGH (ref 0–99)
Triglycerides: 74 mg/dL (ref 0–149)
VLDL Cholesterol Cal: 13 mg/dL (ref 5–40)

## 2019-10-01 LAB — CBC
Hematocrit: 37.9 % (ref 34.0–46.6)
Hemoglobin: 12.7 g/dL (ref 11.1–15.9)
MCH: 30.8 pg (ref 26.6–33.0)
MCHC: 33.5 g/dL (ref 31.5–35.7)
MCV: 92 fL (ref 79–97)
Platelets: 232 10*3/uL (ref 150–450)
RBC: 4.12 x10E6/uL (ref 3.77–5.28)
RDW: 11.8 % (ref 11.7–15.4)
WBC: 5.6 10*3/uL (ref 3.4–10.8)

## 2019-10-05 LAB — CYTOLOGY - PAP
Comment: NEGATIVE
Diagnosis: NEGATIVE
High risk HPV: NEGATIVE

## 2019-10-13 IMAGING — MG DIGITAL SCREENING BILATERAL MAMMOGRAM WITH CAD
4 series · 4 of 4 positions shown · non-contrast
Comparison: None.

CLINICAL DATA: Screening.

EXAM:
DIGITAL SCREENING BILATERAL MAMMOGRAM WITH CAD

[L CC]
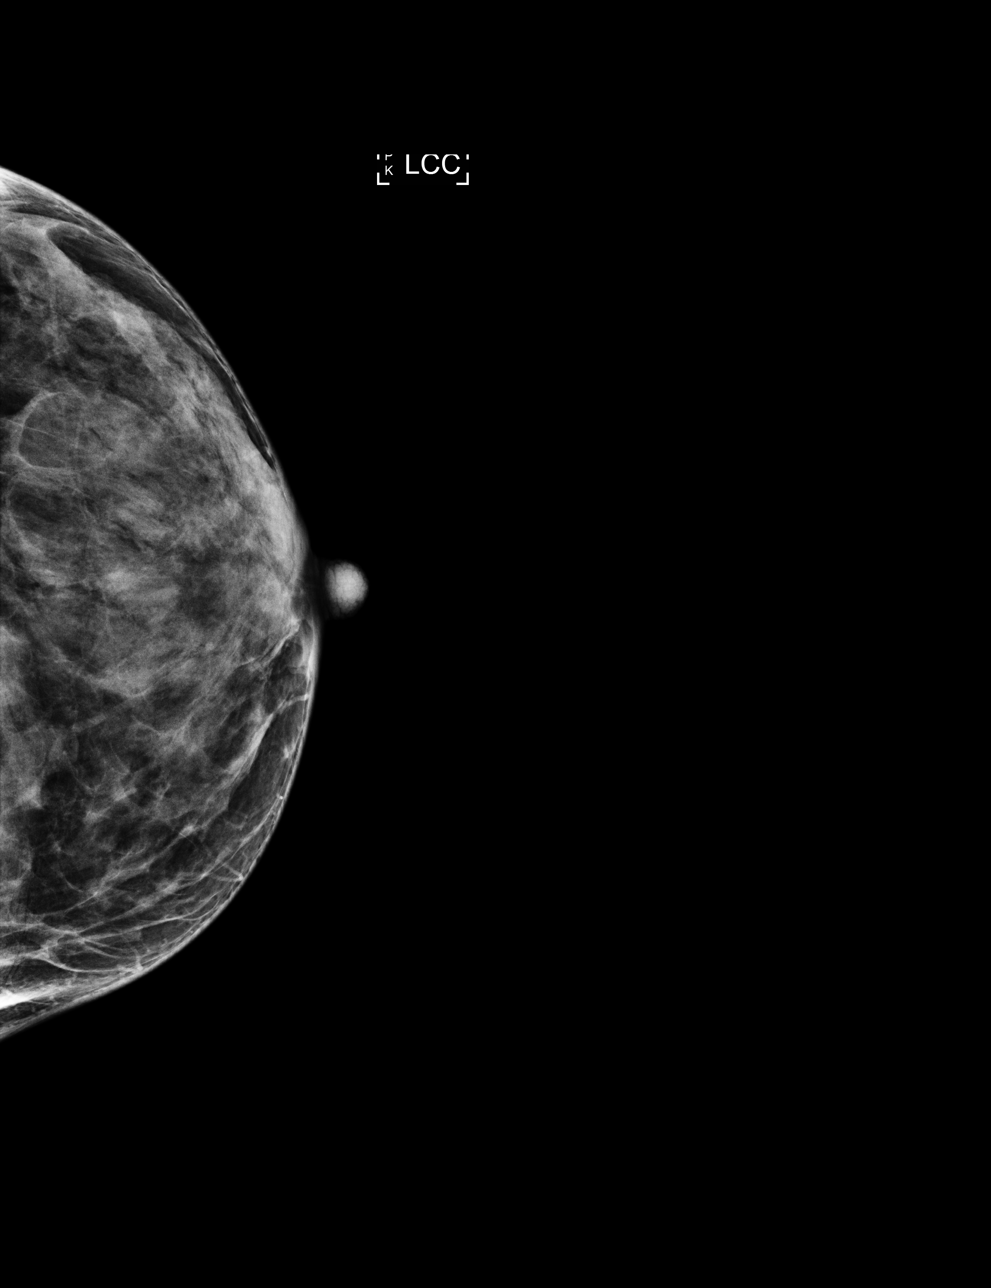

[R MLO]
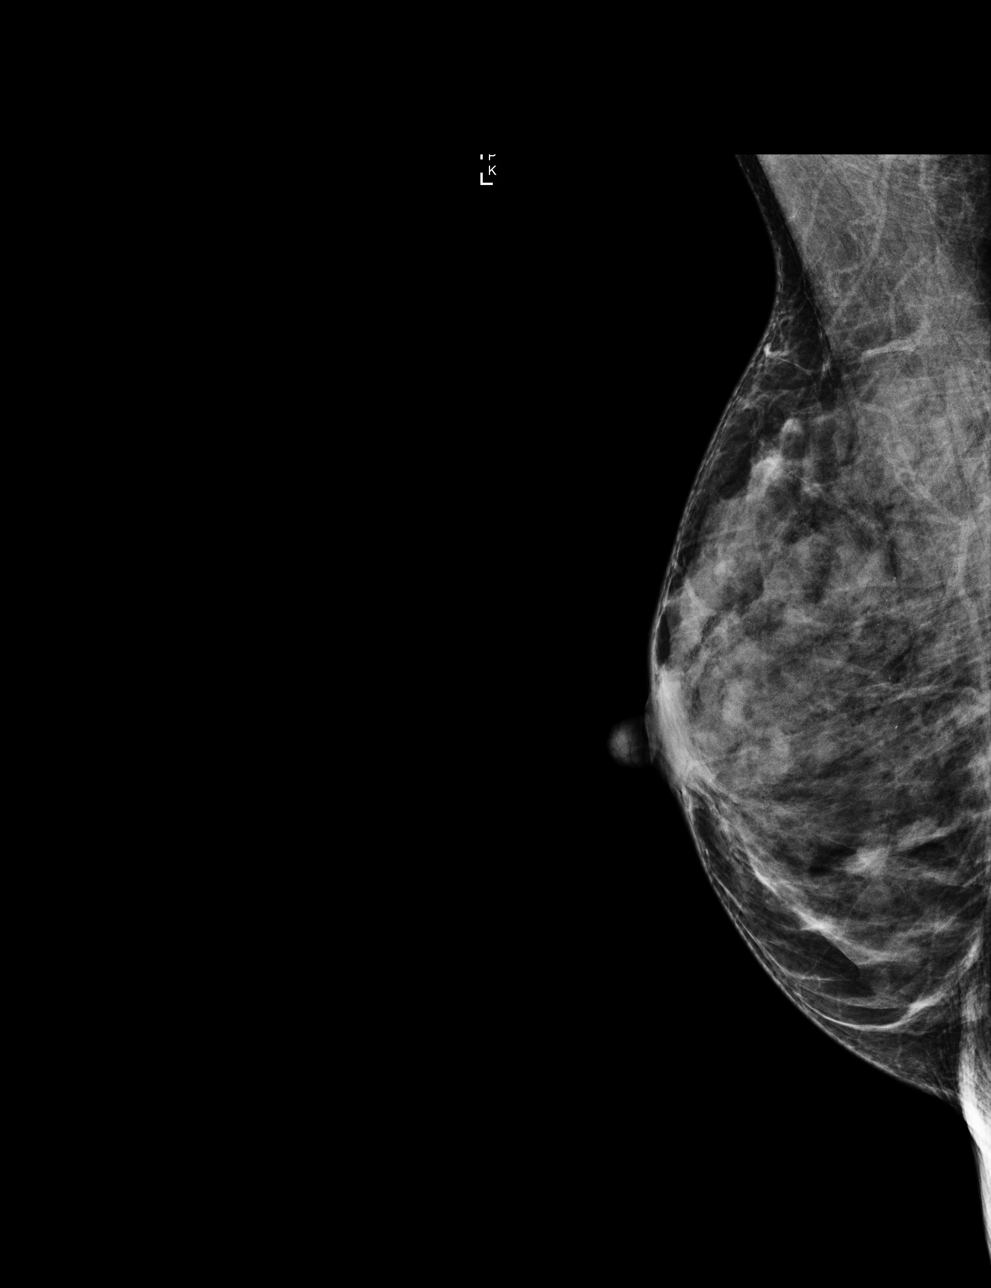

[L MLO]
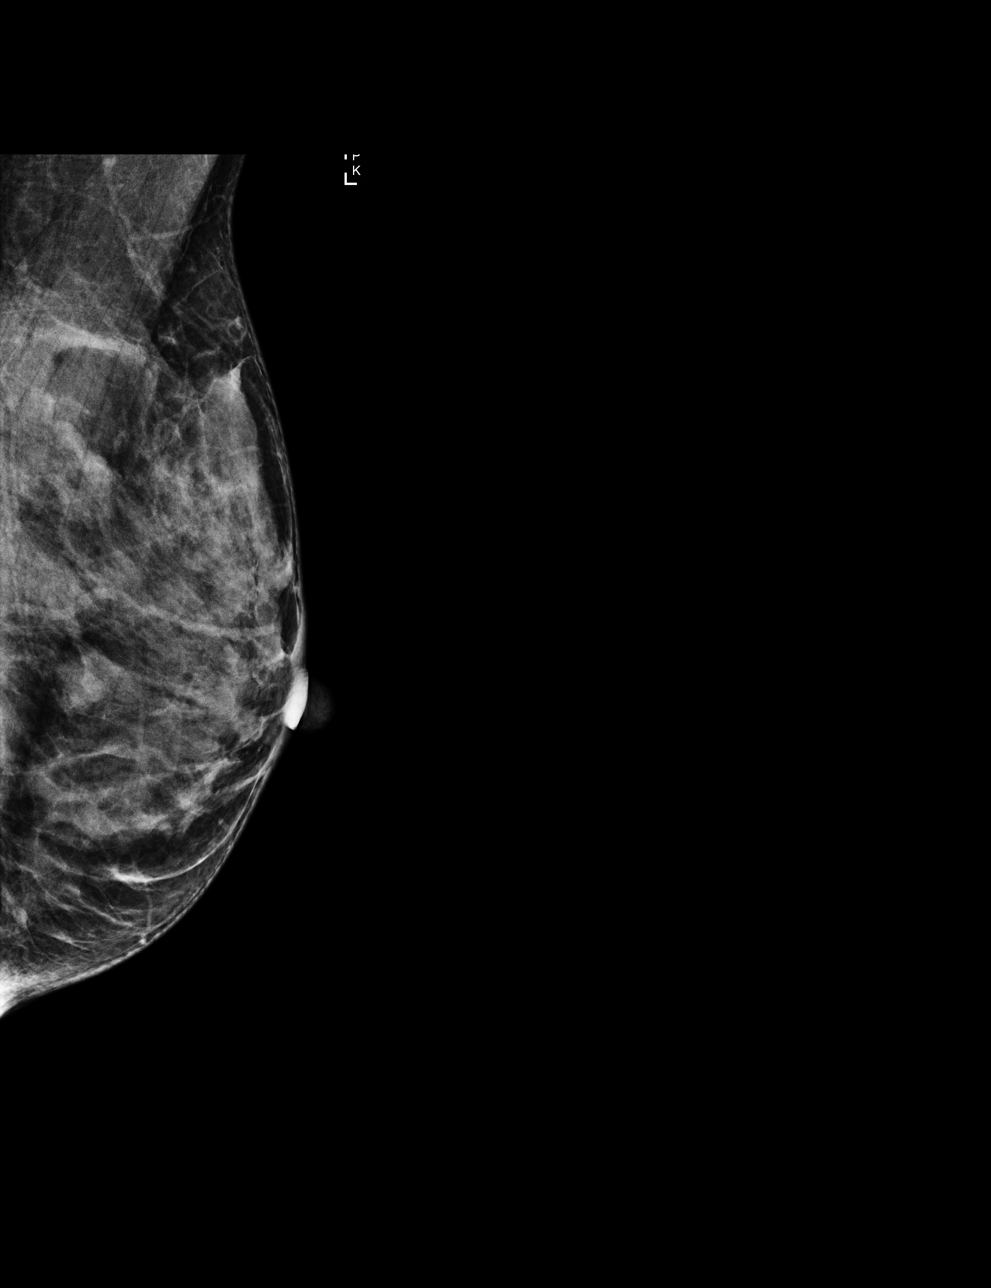

[R CC]
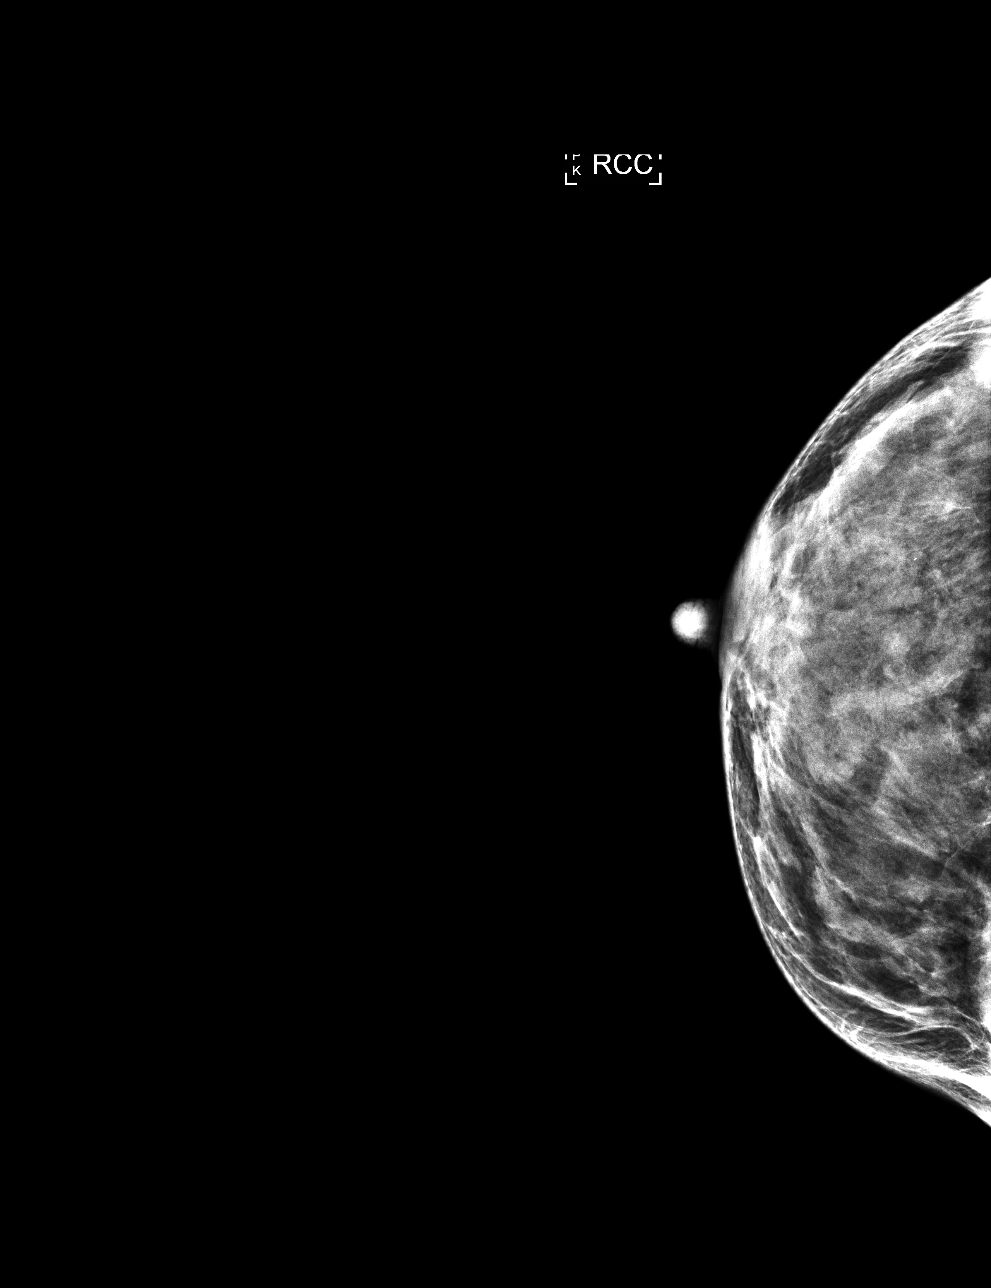

[4 of 4 positions shown; findings below may reference images not displayed]

ACR Breast Density Category c: The breast tissue is heterogeneously
dense, which may obscure small masses.
FINDINGS: In the right breast calcifications requires further evaluation.

In the left breast calcifications requires further evaluation.

Images were processed with CAD.
IMPRESSION: Further evaluation is suggested for calcifications in the right
breast.

Further evaluation is suggested for calcifications in the left
breast.

RECOMMENDATION:
Diagnostic mammogram and possibly ultrasound of both breasts.
(Code:[id number]-[id number])

The patient will be contacted regarding the findings, and additional
imaging will be scheduled.

BI-RADS CATEGORY  0: Incomplete. Need additional imaging evaluation
and/or prior mammograms for comparison.

## 2019-10-30 IMAGING — MG DIGITAL DIAGNOSTIC BILATERAL MAMMOGRAM WITH CAD
7 series · 7 of 7 positions shown · non-contrast
Comparison: Previous exam(s).

CLINICAL DATA: Patient recalled from screening for bilateral
calcifications.

EXAM:
DIGITAL DIAGNOSTIC BILATERAL MAMMOGRAM WITH CAD

[L ML (1 of 2)]
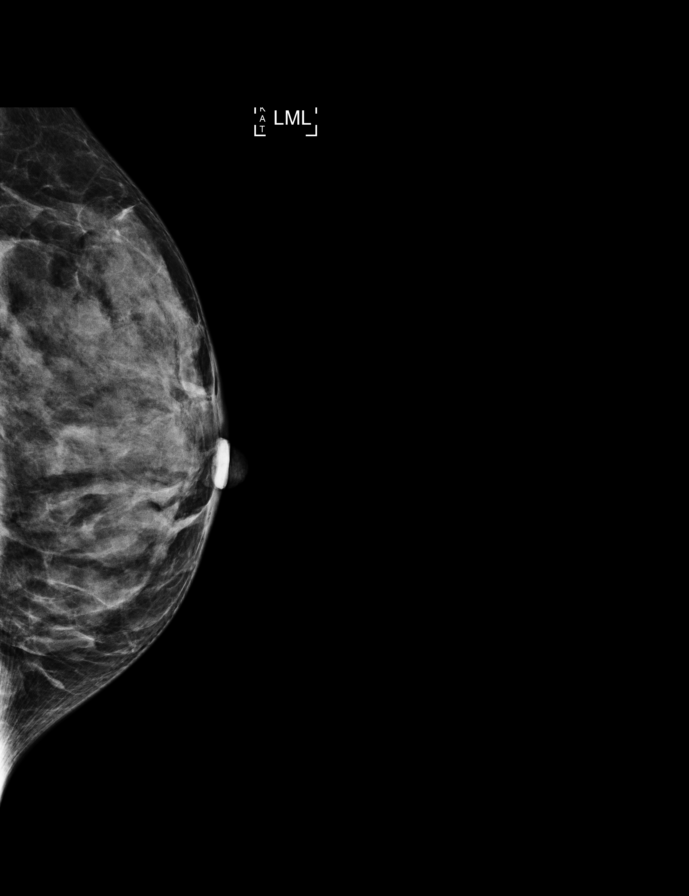

[L ML (2 of 2)]
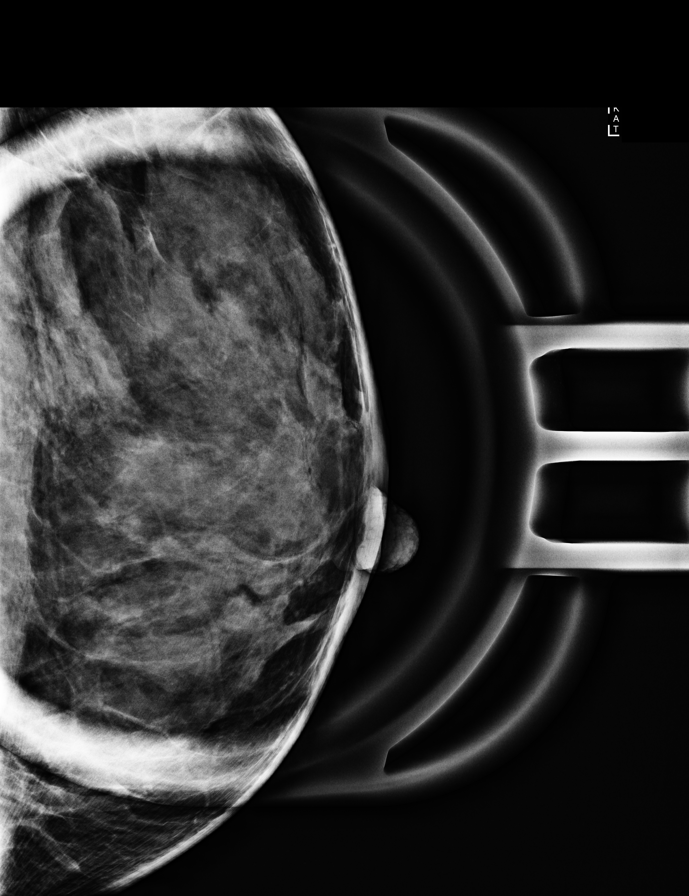

[R CC (1 of 2)]
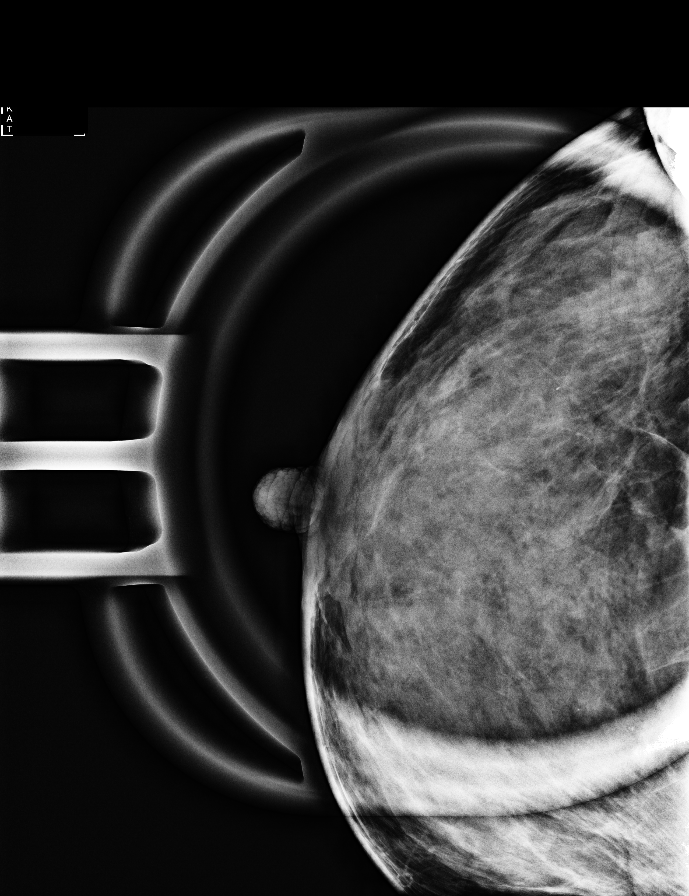

[L CC]
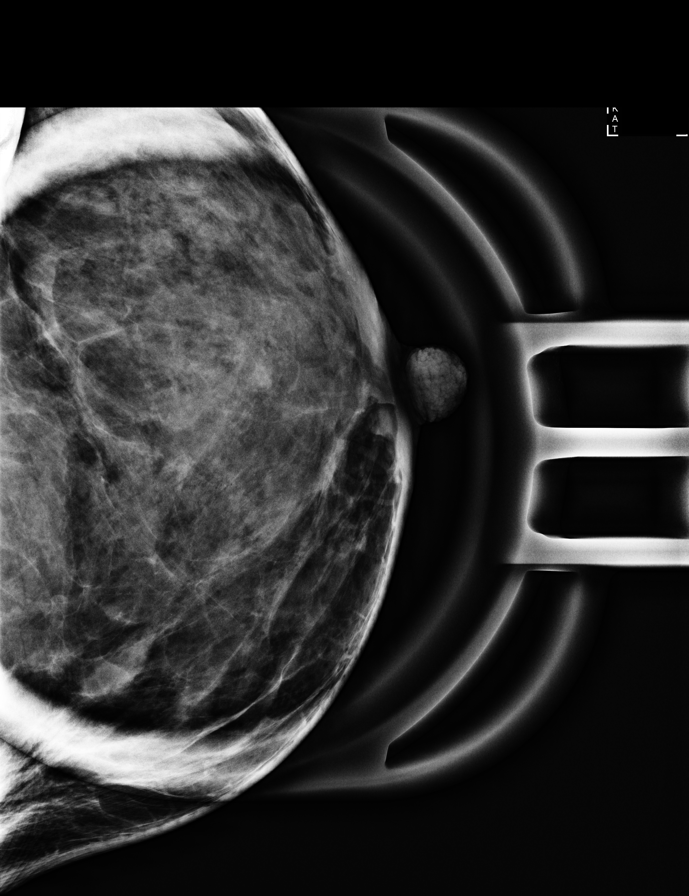

[R CC (2 of 2)]
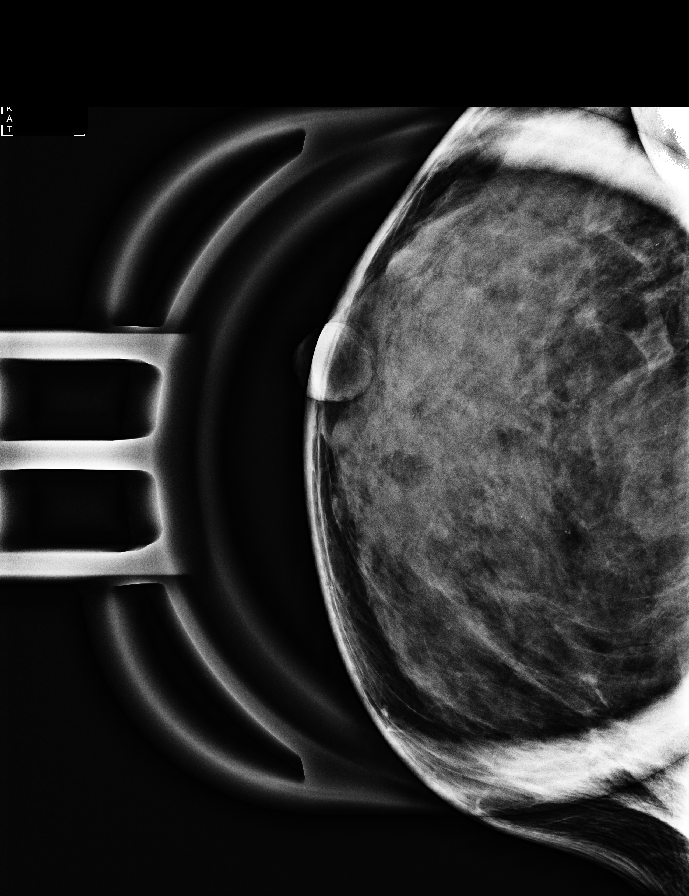

[R ML (1 of 2)]
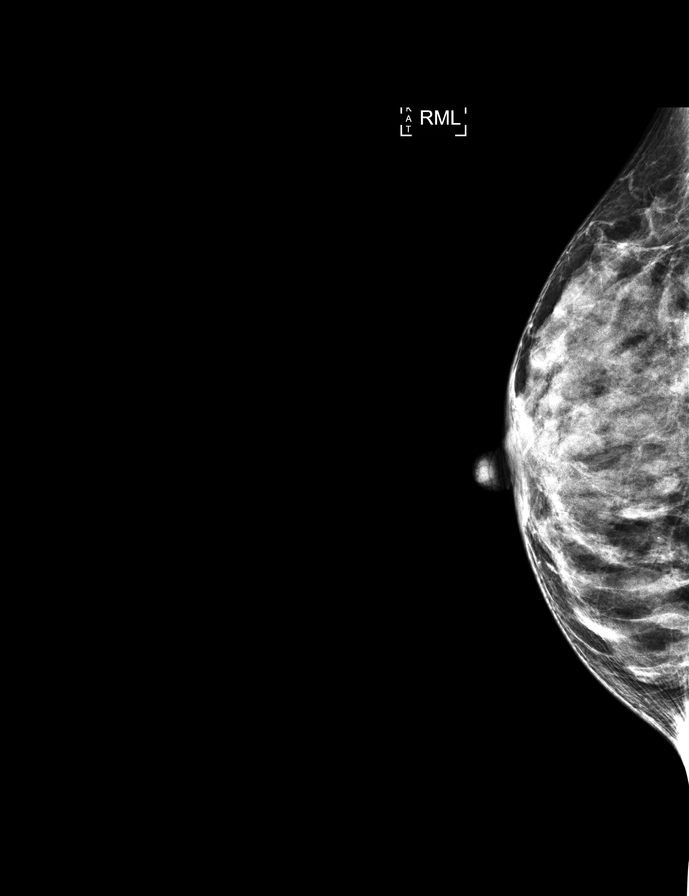

[R ML (2 of 2)]
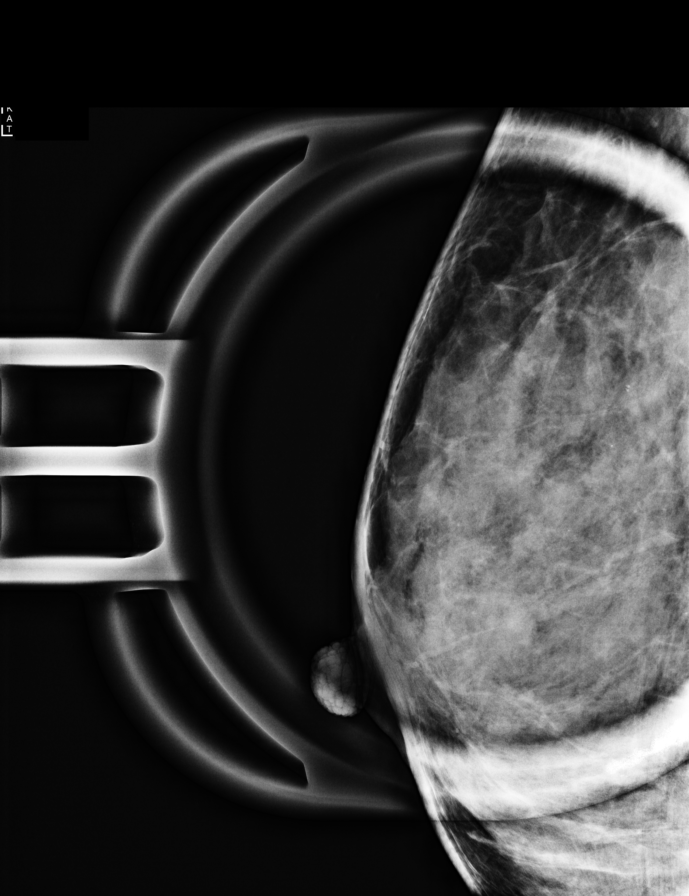

[7 of 7 positions shown; findings below may reference images not displayed]

ACR Breast Density Category c: The breast tissue is heterogeneously
dense, which may obscure small masses.
FINDINGS: Magnification views of the right and left breast were obtained.
Within the upper outer and upper inner right breast there are
loosely grouped round and punctate calcifications.

No discrete calcifications identified on magnification views
involving the left breast.

Mammographic images were processed with CAD.
IMPRESSION: Probably benign right breast calcifications.

RECOMMENDATION:
Right breast magnification views in 6 months to ensure stability of
probably benign right breast calcifications.

I have discussed the findings and recommendations with the patient.
Results were also provided in writing at the conclusion of the
visit. If applicable, a reminder letter will be sent to the patient
regarding the next appointment.

BI-RADS CATEGORY  3: Probably benign.
